# Patient Record
Sex: Female | Born: 1995 | Race: White | Hispanic: No | Marital: Married | Smoking: Never smoker
Health system: Southern US, Community
[De-identification: ages and names within clinical notes are randomized; demographics above are authoritative.]

## PROBLEM LIST (undated history)

## (undated) ENCOUNTER — Inpatient Hospital Stay (HOSPITAL_COMMUNITY): Payer: Self-pay

## (undated) DIAGNOSIS — Z8679 Personal history of other diseases of the circulatory system: Secondary | ICD-10-CM

## (undated) DIAGNOSIS — D649 Anemia, unspecified: Secondary | ICD-10-CM

## (undated) DIAGNOSIS — I471 Supraventricular tachycardia: Secondary | ICD-10-CM

## (undated) HISTORY — DX: Personal history of other diseases of the circulatory system: Z86.79

## (undated) HISTORY — PX: WISDOM TOOTH EXTRACTION: SHX21

## (undated) HISTORY — DX: Supraventricular tachycardia: I47.1

---
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
## 2015-11-29 ENCOUNTER — Ambulatory Visit (INDEPENDENT_AMBULATORY_CARE_PROVIDER_SITE_OTHER): Payer: BLUE CROSS/BLUE SHIELD | Admitting: Family Medicine

## 2015-11-29 ENCOUNTER — Encounter: Payer: Self-pay | Admitting: Family Medicine

## 2015-11-29 VITALS — BP 100/59 | HR 86 | Ht 65.0 in | Wt 131.0 lb

## 2015-11-29 DIAGNOSIS — Z7189 Other specified counseling: Secondary | ICD-10-CM | POA: Diagnosis not present

## 2015-11-29 DIAGNOSIS — N926 Irregular menstruation, unspecified: Secondary | ICD-10-CM | POA: Diagnosis not present

## 2015-11-29 DIAGNOSIS — Z0001 Encounter for general adult medical examination with abnormal findings: Secondary | ICD-10-CM | POA: Diagnosis not present

## 2015-11-29 DIAGNOSIS — Z0000 Encounter for general adult medical examination without abnormal findings: Secondary | ICD-10-CM

## 2015-11-29 DIAGNOSIS — Z23 Encounter for immunization: Secondary | ICD-10-CM

## 2015-11-29 DIAGNOSIS — Z7184 Encounter for health counseling related to travel: Secondary | ICD-10-CM

## 2015-11-29 MED ORDER — TYPHOID VACCINE PO CPDR: 1.0000 | DELAYED_RELEASE_CAPSULE | ORAL | Status: DC

## 2015-11-29 MED ORDER — ATOVAQUONE-PROGUANIL HCL 250-100 MG PO TABS: 1.0000 | ORAL_TABLET | Freq: Every day | ORAL | Status: DC

## 2015-11-29 NOTE — Progress Notes (Signed)
Subjective:     Shelby Carr is a 20 y.o. female and is here for a comprehensive physical exam. The patient reports problems - periods will occ stop for a few months at time and then restart. Seems ot happen when she is more stressed. she is not on birth control and is not sexually active.    She is traveling to [place] at the beginning of June for Mission trip. She will be there for one month. She wants to know what vaccines she will need.    Denies any prior hx of sexual activity.  Says does self breast exams.   Social History   Social History  . Marital Status: Single    Spouse Name: N/A  . Number of Children: N/A  . Years of Education: N/A   Occupational History  . Student, college    Social History Main Topics  . Smoking status: Never Smoker   . Smokeless tobacco: Not on file  . Alcohol Use: No  . Drug Use: No  . Sexual Activity: No   Other Topics Concern  . Not on file   Social History Narrative   Lives with her mother and father and sister   Health Maintenance  Topic Date Due  . HIV Screening  10/17/2009  . TETANUS/TDAP  10/17/2013  . INFLUENZA VACCINE  05/06/2015  . PAP SMEAR  10/18/2015    The following portions of the patient's history were reviewed and updated as appropriate: allergies, current medications, past family history, past medical history, past social history, past surgical history and problem list.  Review of Systems A comprehensive review of systems was negative.   Objective:    BP 100/59 mmHg  Pulse 86  Ht  (1.651 m)  Wt 131 lb (59.421 kg)  BMI 21.80 kg/m2  SpO2 99%  LMP 11/19/2015 (Approximate) General appearance: alert, cooperative and appears stated age Head: Normocephalic, without obvious abnormality, atraumatic Eyes: conj clear, EOMI, PEERLA Ears: normal TM's and external ear canals both ears Nose: Nares normal. Septum midline. Mucosa normal. No drainage or sinus tenderness. Throat: lips, mucosa, and tongue normal; teeth  and gums normal Neck: no adenopathy, no carotid bruit, no JVD, supple, symmetrical, trachea midline and thyroid not enlarged, symmetric, no tenderness/mass/nodules Back: symmetric, no curvature. ROM normal. No CVA tenderness. Lungs: clear to auscultation bilaterally Breasts: patient declined exam Heart: regular rate and rhythm, S1, S2 normal, no murmur, click, rub or gallop Abdomen: soft, non-tender; bowel sounds normal; no masses,  no organomegaly Pelvic: deferred and not sexually active and no prior hx.  Extremities: extremities normal, atraumatic, no cyanosis or edema Pulses: 2+ and symmetric Skin: Skin color, texture, turgor normal. No rashes or lesions Lymph nodes: Cervical, supraclavicular, and axillary nodes normal. Neurologic: Alert and oriented X 3, normal strength and tone. Normal symmetric reflexes. Normal coordination and gait    Assessment:    Healthy female exam.      Plan:     See After Visit Summary for Counseling Recommendations  Keep up a regular exercise program and make sure you are eating a healthy diet Try to eat 4 servings of dairy a day, or if you are lactose intolerant take a calcium with vitamin D daily.  Your vaccines are up to date.   Irregular periods. tsh 2 years ago was normal. Consider PCOS though no other overt signs of PCOS.  She declined labwork today.  Consider OCP to control cycle. She wants think about it.   Travel counseling -  She is going to menopause [place]. We discussed that she needs to get an up-to-date to that. Also recommend typhoid oral vaccine. Prescription printed. Encouraged her to take it several weeks before travel to that she complete complete the course one week before she leaves the country. She may need malaria prophylaxis as well. Will treat malarone,  New Rx sent to pharmacy to start 2 days before exposure and continue until 7 days after return to country.    She declined Gardisil vaccine today. We discussed trying to complete  series before she becomes sexually active but she says she wants to think about it.

## 2016-02-15 ENCOUNTER — Emergency Department
Admission: EM | Admit: 2016-02-15 | Discharge: 2016-02-15 | Disposition: A | Discharge status: Home / Self Care | Payer: BLUE CROSS/BLUE SHIELD | Source: Home / Self Care | Attending: Family Medicine | Admitting: Family Medicine

## 2016-02-15 ENCOUNTER — Encounter: Payer: Self-pay | Admitting: Emergency Medicine

## 2016-02-15 DIAGNOSIS — H6502 Acute serous otitis media, left ear: Secondary | ICD-10-CM

## 2016-02-15 DIAGNOSIS — J0100 Acute maxillary sinusitis, unspecified: Secondary | ICD-10-CM | POA: Diagnosis not present

## 2016-02-15 MED ORDER — AMOXICILLIN-POT CLAVULANATE 875-125 MG PO TABS: 1.0000 | ORAL_TABLET | Freq: Two times a day (BID) | ORAL | Status: DC

## 2016-02-15 NOTE — ED Provider Notes (Signed)
CSN: [id number]     Arrival date & time 02/15/16  1015 History   First MD Initiated Contact with Patient 02/15/16 1033     Chief Complaint  Patient presents with  . Nasal Congestion  . Otalgia  . Facial Pain  . Sore Throat   (Consider location/radiation/quality/duration/timing/severity/associated sxs/prior Treatment) HPI The pt is a 20yo female [facility] with c/o 4 days of worsening sinus congestion with facial pain, Left ear pain, and sore throat.  Sore throat is mild, she does not believe it feels like strep throat. Left ear pain is aching and throbbing, most bothersome for pt.  Moderate in severity.  She took Dayquil and Nyquil once.  Unknown fever. Denies n/v/d. She is home for the summer from college.  History reviewed. No pertinent past medical history. Past Surgical History  Procedure Laterality Date  . Tonsillectomy and adenoidectomy  2000   Family History  Problem Relation Age of Onset  . Alcohol abuse Maternal Grandmother   . Heart attack Paternal Grandfather   . Hyperlipidemia Maternal Grandfather   . Hypertension Paternal Grandfather    Social History  Substance Use Topics  . Smoking status: Never Smoker   . Smokeless tobacco: None  . Alcohol Use: No   OB History    No data available     Review of Systems  Constitutional: Negative for fever and chills.  HENT: Positive for congestion, ear pain (Left), rhinorrhea, sinus pressure and sore throat. Negative for trouble swallowing and voice change.   Respiratory: Negative for cough and shortness of breath.   Cardiovascular: Negative for chest pain and palpitations.  Gastrointestinal: Negative for nausea, vomiting, abdominal pain and diarrhea.  Musculoskeletal: Negative for myalgias, back pain and arthralgias.  Skin: Negative for rash.  Neurological: Positive for headaches ( frontal). Negative for dizziness and light-headedness.    Allergies  Review of patient's allergies indicates no known  allergies.  Home Medications   Prior to Admission medications   Medication Sig Start Date End Date Taking? Authorizing Provider  amoxicillin-clavulanate (AUGMENTIN) 875-125 MG tablet Take 1 tablet by mouth 2 (two) times daily. One po bid x 7 days 02/15/16   Junius FinnerErin O'Malley, PA-C  atovaquone-proguanil (MALARONE) 250-100 MG TABS tablet Take 1 tablet by mouth daily. Start 2 days before leave the country and continue for 7 more days once back in the [place] Patient taking differently: Take 1 tablet by mouth daily. Start 2 days before leave the country and continue for 7 more days once back in the [place] 11/29/15   Agapito Gamesatherine D Metheney, MD  typhoid (VIVOTIF) DR capsule Take 1 capsule by mouth every other day. X 4 doses. Complete one week prior to travel. Patient taking differently: Take 1 capsule by mouth every other day. X 4 doses. Complete one week prior to travel. 11/29/15   Agapito Gamesatherine D Metheney, MD   Meds Ordered and Administered this Visit  Medications - No data to display  BP 100/67 mmHg  Pulse 107  Temp(Src) 98.4 F (36.9 C) (Oral)  Resp 16  Ht 5\' 5"  (1.651 m)  Wt 130 lb (58.968 kg)  BMI 21.63 kg/m2  SpO2 99%  LMP 01/16/2016 (Approximate) No data found.   Physical Exam  Constitutional: She appears well-developed and well-nourished. No distress.  HENT:  Head: Normocephalic and atraumatic.  Right Ear: Tympanic membrane is scarred.  Left Ear: Tympanic membrane is scarred, erythematous and bulging. A middle ear effusion is present.  Nose: Mucosal edema present. Right sinus exhibits no maxillary  sinus tenderness and no frontal sinus tenderness. Left sinus exhibits maxillary sinus tenderness. Left sinus exhibits no frontal sinus tenderness.  Mouth/Throat: Mucous membranes are normal. Posterior oropharyngeal erythema present. No oropharyngeal exudate or posterior oropharyngeal edema.  Eyes: Conjunctivae are normal. No scleral icterus.  Neck: Normal range of motion. Neck supple.  Cardiovascular:  Normal rate, regular rhythm and normal heart sounds.   Pulmonary/Chest: Effort normal and breath sounds normal. No stridor. No respiratory distress. She has no wheezes. She has no rales.  Abdominal: Soft. She exhibits no distension. There is no tenderness.  Musculoskeletal: Normal range of motion.  Lymphadenopathy:    She has no cervical adenopathy.  Neurological: She is alert.  Skin: Skin is warm and dry. She is not diaphoretic.  Nursing note and vitals reviewed.   ED Course  Procedures (including critical care time)  Labs Review Labs Reviewed - No data to display  Imaging Review No results found.    MDM   1. Acute serous otitis media of left ear, recurrence not specified   2. Acute maxillary sinusitis, recurrence not specified    Pt c/o 4 days of worsening sinus symptoms. Left ear c/w AOM  Rx: Augmentin  Advised pt to use acetaminophen and ibuprofen as needed for fever and pain. Encouraged rest and fluids. F/u with PCP in 7-10 days if not improving, sooner if worsening. Pt verbalized understanding and agreement with tx plan.    Junius Finner, PA-C 02/15/16 1113

## 2016-02-15 NOTE — ED Notes (Signed)
Patient report 4 days of progressive worsening sinus congestion and pressure with ear pain; today throat is a little sore; no known fever.

## 2016-02-15 NOTE — Discharge Instructions (Signed)

## 2017-10-01 ENCOUNTER — Ambulatory Visit (INDEPENDENT_AMBULATORY_CARE_PROVIDER_SITE_OTHER): Payer: BLUE CROSS/BLUE SHIELD | Admitting: Family Medicine

## 2017-10-01 ENCOUNTER — Encounter: Payer: Self-pay | Admitting: Family Medicine

## 2017-10-01 VITALS — BP 111/54 | HR 99 | Ht 65.0 in | Wt 130.0 lb

## 2017-10-01 DIAGNOSIS — Z0000 Encounter for general adult medical examination without abnormal findings: Secondary | ICD-10-CM

## 2017-10-01 DIAGNOSIS — Z23 Encounter for immunization: Secondary | ICD-10-CM | POA: Diagnosis not present

## 2017-10-01 NOTE — Addendum Note (Signed)
Addended by: Deno EtienneBARKLEY, Francetta Ilg L on: 10/01/2017 12:27 PM   Modules accepted: Orders

## 2017-10-01 NOTE — Progress Notes (Signed)
Subjective:     Shelby Carr is a 21 y.o. female and is here for a comprehensive physical exam. The patient reports no problems.she is doing well. Finishing up grad school for [employer]. She will be done this summer and then looking for a job. She has never been sexually active.    Social History   Socioeconomic History  . Marital status: Single    Spouse name: Not on file  . Number of children: Not on file  . Years of education: Not on file  . Highest education level: Not on file  Social Needs  . Financial resource strain: Not on file  . Food insecurity - worry: Not on file  . Food insecurity - inability: Not on file  . Transportation needs - medical: Not on file  . Transportation needs - non-medical: Not on file  Occupational History  . Occupation: Consulting civil engineertudent, college  Tobacco Use  . Smoking status: Never Smoker  . Smokeless tobacco: Never Used  Substance and Sexual Activity  . Alcohol use: No  . Drug use: No  . Sexual activity: No  Other Topics Concern  . Not on file  Social History Narrative   Lives with her mother and father and sister   Health Maintenance  Topic Date Due  . INFLUENZA VACCINE  06/03/2018 (Originally 05/05/2017)  . PAP SMEAR  10/01/2018 (Originally 10/18/2015)  . HIV Screening  10/01/2018 (Originally 10/17/2009)  . TETANUS/TDAP  11/28/2025    The following portions of the patient's history were reviewed and updated as appropriate: allergies, current medications, past family history, past medical history, past social history, past surgical history and problem list.  Review of Systems A comprehensive review of systems was negative.   Objective:    BP (!) 111/54   Pulse 99   Ht 5\' 5"  (1.651 m)   Wt 130 lb (59 kg)   LMP 09/14/2017 (Approximate)   SpO2 99%   BMI 21.63 kg/m  General appearance: alert, cooperative and appears stated age Head: Normocephalic, without obvious abnormality, atraumatic Eyes: conj clear, EOMI, PEERLA Ears: normal TM's and  external ear canals both ears Nose: Nares normal. Septum midline. Mucosa normal. No drainage or sinus tenderness. Throat: lips, mucosa, and tongue normal; teeth and gums normal Neck: no adenopathy, no carotid bruit, no JVD, supple, symmetrical, trachea midline and thyroid not enlarged, symmetric, no tenderness/mass/nodules Back: symmetric, no curvature. ROM normal. No CVA tenderness. Lungs: clear to auscultation bilaterally Breasts: normal appearance, no masses or tenderness Heart: regular rate and rhythm, S1, S2 normal, no murmur, click, rub or gallop Abdomen: soft, non-tender; bowel sounds normal; no masses,  no organomegaly Extremities: extremities normal, atraumatic, no cyanosis or edema Pulses: 2+ and symmetric Skin: Skin color, texture, turgor normal. No rashes or lesions Lymph nodes: Cervical, supraclavicular, and axillary nodes normal. Neurologic: Alert and oriented X 3, normal strength and tone. Normal symmetric reflexes. Normal coordination and gait    Assessment:    Healthy female exam.      Plan:     See After Visit Summary for Counseling Recommendations   Keep up a regular exercise program and make sure you are eating a healthy diet Try to eat 4 servings of dairy a day, or if you are lactose intolerant take a calcium with vitamin D daily.  Your vaccines are up to date.  Will defer pap smear for one year sine never sexually active. Did perform Urine GC/chlamydia.   Declined flu vaccine Given first Gardasil. F/U in 2 months  for 2nd.

## 2017-10-01 NOTE — Patient Instructions (Addendum)

## 2017-10-02 LAB — C. TRACHOMATIS/N. GONORRHOEAE RNA
C. TRACHOMATIS RNA, TMA: NOT DETECTED
N. GONORRHOEAE RNA, TMA: NOT DETECTED

## 2017-12-03 ENCOUNTER — Ambulatory Visit: Payer: BLUE CROSS/BLUE SHIELD

## 2017-12-10 ENCOUNTER — Ambulatory Visit (INDEPENDENT_AMBULATORY_CARE_PROVIDER_SITE_OTHER): Payer: BLUE CROSS/BLUE SHIELD | Admitting: Family Medicine

## 2017-12-10 VITALS — BP 105/65 | HR 99 | Wt 130.0 lb

## 2017-12-10 DIAGNOSIS — Z23 Encounter for immunization: Secondary | ICD-10-CM

## 2017-12-10 NOTE — Progress Notes (Signed)
Agree with documentation as above.   Jaman Aro, MD  

## 2017-12-10 NOTE — Progress Notes (Signed)
   Subjective:    Patient ID: Shelby LamingEmily A Carr, female    DOB: [birth year], 22 y.o.   MRN: [id number]  HPI  Shelby Carr is here for 2 nd HPV vaccine. Denies any problems with the first vaccine.   Review of Systems     Objective:   Physical Exam        Assessment & Plan:  Patient tolerated injection well without complications. Patient advised to schedule next injection 6 months from first injection.

## 2018-04-01 ENCOUNTER — Ambulatory Visit (INDEPENDENT_AMBULATORY_CARE_PROVIDER_SITE_OTHER): Payer: BLUE CROSS/BLUE SHIELD | Admitting: Family Medicine

## 2018-04-01 VITALS — BP 103/66 | HR 90 | Temp 98.5°F

## 2018-04-01 DIAGNOSIS — Z23 Encounter for immunization: Secondary | ICD-10-CM

## 2018-04-01 NOTE — Progress Notes (Signed)
Agree with documentation as above.   Gregorey Nabor, MD  

## 2018-04-01 NOTE — Progress Notes (Signed)
Pt came into clinic today for 3rd and final HPV vaccine. Pt reports no negative side effects from previous vaccines. Pt tolerated injection in left deltoid well, no immediate complications. Advised to contact clinic with any questions or concerns. [facility] updated.

## 2019-03-22 ENCOUNTER — Telehealth: Payer: Self-pay | Admitting: Family Medicine

## 2019-03-22 NOTE — Telephone Encounter (Signed)
Do I have to see a gynecologist now that I am almost 24?

## 2019-03-22 NOTE — Telephone Encounter (Signed)
Sent pt a mychart message regarding this.Maryruth Eve, Lahoma Crocker, CMA

## 2019-04-06 ENCOUNTER — Ambulatory Visit (INDEPENDENT_AMBULATORY_CARE_PROVIDER_SITE_OTHER): Payer: BC Managed Care – PPO | Admitting: Family Medicine

## 2019-04-06 ENCOUNTER — Encounter: Payer: Self-pay | Admitting: Family Medicine

## 2019-04-06 ENCOUNTER — Other Ambulatory Visit (HOSPITAL_COMMUNITY)
Admission: RE | Admit: 2019-04-06 | Discharge: 2019-04-06 | Disposition: A | Discharge status: Home / Self Care | Payer: BC Managed Care – PPO | Source: Ambulatory Visit | Attending: Family Medicine | Admitting: Family Medicine

## 2019-04-06 VITALS — BP 121/81 | HR 118 | Temp 99.1°F | Ht 65.0 in | Wt 123.0 lb

## 2019-04-06 DIAGNOSIS — Z01419 Encounter for gynecological examination (general) (routine) without abnormal findings: Secondary | ICD-10-CM | POA: Insufficient documentation

## 2019-04-06 DIAGNOSIS — Z124 Encounter for screening for malignant neoplasm of cervix: Secondary | ICD-10-CM

## 2019-04-06 DIAGNOSIS — Z0000 Encounter for general adult medical examination without abnormal findings: Secondary | ICD-10-CM | POA: Diagnosis not present

## 2019-04-06 NOTE — Patient Instructions (Signed)
 Preventive Care 21-24 Years Old, Female Preventive care refers to visits with your health care provider and lifestyle choices that can promote health and wellness. This includes:  A yearly physical exam. This may also be called an annual well check.  Regular dental visits and eye exams.  Immunizations.  Screening for certain conditions.  Healthy lifestyle choices, such as eating a healthy diet, getting regular exercise, not using drugs or products that contain nicotine and tobacco, and limiting alcohol use. What can I expect for my preventive care visit? Physical exam Your health care provider will check your:  Height and weight. This may be used to calculate body mass index (BMI), which tells if you are at a healthy weight.  Heart rate and blood pressure.  Skin for abnormal spots. Counseling Your health care provider may ask you questions about your:  Alcohol, tobacco, and drug use.  Emotional well-being.  Home and relationship well-being.  Sexual activity.  Eating habits.  Work and work environment.  Method of birth control.  Menstrual cycle.  Pregnancy history. What immunizations do I need?  Influenza (flu) vaccine  This is recommended every year. Tetanus, diphtheria, and pertussis (Tdap) vaccine  You may need a Td booster every 10 years. Varicella (chickenpox) vaccine  You may need this if you have not been vaccinated. Human papillomavirus (HPV) vaccine  If recommended by your health care provider, you may need three doses over 6 months. Measles, mumps, and rubella (MMR) vaccine  You may need at least one dose of MMR. You may also need a second dose. Meningococcal conjugate (MenACWY) vaccine  One dose is recommended if you are age 19-21 years and a first-year college student living in a residence hall, or if you have one of several medical conditions. You may also need additional booster doses. Pneumococcal conjugate (PCV13) vaccine  You may need  this if you have certain conditions and were not previously vaccinated. Pneumococcal polysaccharide (PPSV23) vaccine  You may need one or two doses if you smoke cigarettes or if you have certain conditions. Hepatitis A vaccine  You may need this if you have certain conditions or if you travel or work in places where you may be exposed to hepatitis A. Hepatitis B vaccine  You may need this if you have certain conditions or if you travel or work in places where you may be exposed to hepatitis B. Haemophilus influenzae type b (Hib) vaccine  You may need this if you have certain conditions. You may receive vaccines as individual doses or as more than one vaccine together in one shot (combination vaccines). Talk with your health care provider about the risks and benefits of combination vaccines. What tests do I need?  Blood tests  Lipid and cholesterol levels. These may be checked every 5 years starting at age 20.  Hepatitis C test.  Hepatitis B test. Screening  Diabetes screening. This is done by checking your blood sugar (glucose) after you have not eaten for a while (fasting).  Sexually transmitted disease (STD) testing.  BRCA-related cancer screening. This may be done if you have a family history of breast, ovarian, tubal, or peritoneal cancers.  Pelvic exam and Pap test. This may be done every 3 years starting at age 21. Starting at age 30, this may be done every 5 years if you have a Pap test in combination with an HPV test. Talk with your health care provider about your test results, treatment options, and if necessary, the need for more   tests. Follow these instructions at home: Eating and drinking   Eat a diet that includes fresh fruits and vegetables, whole grains, lean protein, and low-fat dairy.  Take vitamin and mineral supplements as recommended by your health care provider.  Do not drink alcohol if: ? Your health care provider tells you not to drink. ? You are  pregnant, may be pregnant, or are planning to become pregnant.  If you drink alcohol: ? Limit how much you have to 0-1 drink a day. ? Be aware of how much alcohol is in your drink. In the [place] one drink equals one 12 oz bottle of beer (355 mL), one 5 oz glass of wine (148 mL), or one 1 oz glass of hard liquor (44 mL). Lifestyle  Take daily care of your teeth and gums.  Stay active. Exercise for at least 30 minutes on 5 or more days each week.  Do not use any products that contain nicotine or tobacco, such as cigarettes, e-cigarettes, and chewing tobacco. If you need help quitting, ask your health care provider.  If you are sexually active, practice safe sex. Use a condom or other form of birth control (contraception) in order to prevent pregnancy and STIs (sexually transmitted infections). If you plan to become pregnant, see your health care provider for a preconception visit. What's next?  Visit your health care provider once a year for a well check visit.  Ask your health care provider how often you should have your eyes and teeth checked.  Stay up to date on all vaccines. This information is not intended to replace advice given to you by your health care provider. Make sure you discuss any questions you have with your health care provider. Document Released: 11/17/2001 Document Revised: 06/02/2018 Document Reviewed: 06/02/2018 Elsevier Patient Education  2020 [employer].

## 2019-04-06 NOTE — Progress Notes (Signed)
Subjective:     Shelby Carr is a 23 y.o. female and is here for a comprehensive physical exam. The patient reports no problems. She is a Systems developerspecial ed teacher.  She is not working out currently. She is not and has never been sexually active.    Social History   Socioeconomic History  . Marital status: Single    Spouse name: Not on file  . Number of children: Not on file  . Years of education: Not on file  . Highest education level: Not on file  Occupational History  . Occupation: Consulting civil engineertudent, college  Social Needs  . Financial resource strain: Not on file  . Food insecurity    Worry: Not on file    Inability: Not on file  . Transportation needs    Medical: Not on file    Non-medical: Not on file  Tobacco Use  . Smoking status: Never Smoker  . Smokeless tobacco: Never Used  Substance and Sexual Activity  . Alcohol use: No  . Drug use: No  . Sexual activity: Never  Lifestyle  . Physical activity    Days per week: Not on file    Minutes per session: Not on file  . Stress: Not on file  Relationships  . Social Musicianconnections    Talks on phone: Not on file    Gets together: Not on file    Attends religious service: Not on file    Active member of club or organization: Not on file    Attends meetings of clubs or organizations: Not on file    Relationship status: Not on file  . Intimate partner violence    Fear of current or ex partner: Not on file    Emotionally abused: Not on file    Physically abused: Not on file    Forced sexual activity: Not on file  Other Topics Concern  . Not on file  Social History Narrative   Lives with her mother and father and sister   Health Maintenance  Topic Date Due  . HIV Screening  10/17/2009  . PAP-Cervical Cytology Screening  10/18/2015  . PAP SMEAR-Modifier  10/18/2015  . INFLUENZA VACCINE  05/06/2019  . TETANUS/TDAP  11/28/2025    The following portions of the patient's history were reviewed and updated as appropriate: allergies,  current medications, past family history, past medical history, past social history, past surgical history and problem list.  Review of Systems A comprehensive review of systems was negative.   Objective:    BP 121/81   Pulse (!) 118   Temp 99.1 F (37.3 C) (Oral)   Ht 5\' 5"  (1.651 m)   Wt 123 lb (55.8 kg)   SpO2 100%   BMI 20.47 kg/m  General appearance: alert, cooperative and appears stated age Head: Normocephalic, without obvious abnormality, atraumatic Eyes: conj clear, EOMI, PEERLA Ears: normal TM's and external ear canals both ears Nose: Nares normal. Septum midline. Mucosa normal. No drainage or sinus tenderness. Throat: lips, mucosa, and tongue normal; teeth and gums normal Neck: no adenopathy, no carotid bruit, no JVD, supple, symmetrical, trachea midline and thyroid not enlarged, symmetric, no tenderness/mass/nodules Back: symmetric, no curvature. ROM normal. No CVA tenderness. Lungs: clear to auscultation bilaterally Breasts: normal appearance, no masses or tenderness Heart: regular rate and rhythm, S1, S2 normal, no murmur, click, rub or gallop Abdomen: soft, non-tender; bowel sounds normal; no masses,  no organomegaly Pelvic: cervix normal in appearance, external genitalia normal, no adnexal masses or tenderness,  no cervical motion tenderness, rectovaginal septum normal, uterus normal size, shape, and consistency and vagina normal without discharge Extremities: extremities normal, atraumatic, no cyanosis or edema Pulses: 2+ and symmetric Skin: Skin color, texture, turgor normal. No rashes or lesions Lymph nodes: Cervical, supraclavicular, and axillary nodes normal. Neurologic: Alert and oriented X 3, normal strength and tone. Normal symmetric reflexes. Normal coordination and gait    Assessment:    Healthy female exam.      Plan:     See After Visit Summary for Counseling Recommendations    Keep up a regular exercise program and make sure you are eating a  healthy diet Try to eat 4 servings of dairy a day, or if you are lactose intolerant take a calcium with vitamin D daily.  Your vaccines are up to date.  Pap smear results pending.

## 2019-04-07 LAB — COMPLETE METABOLIC PANEL WITH GFR
AG Ratio: 1.9 (calc) (ref 1.0–2.5)
ALT: 11 U/L (ref 6–29)
AST: 17 U/L (ref 10–30)
Albumin: 4.6 g/dL (ref 3.6–5.1)
Alkaline phosphatase (APISO): 44 U/L (ref 31–125)
BUN: 9 mg/dL (ref 7–25)
CO2: 23 mmol/L (ref 20–32)
Calcium: 9.8 mg/dL (ref 8.6–10.2)
Chloride: 104 mmol/L (ref 98–110)
Creat: 0.62 mg/dL (ref 0.50–1.10)
GFR, Est African American: 146 mL/min/{1.73_m2} (ref >=60)
GFR, Est Non African American: 126 mL/min/{1.73_m2} (ref >=60)
Globulin: 2.4 g/dL (calc) (ref 1.9–3.7)
Glucose, Bld: 110 mg/dL — ABNORMAL HIGH (ref 65–99)
Potassium: 4.2 mmol/L (ref 3.5–5.3)
Sodium: 136 mmol/L (ref 135–146)
Total Bilirubin: 0.6 mg/dL (ref 0.2–1.2)
Total Protein: 7 g/dL (ref 6.1–8.1)

## 2019-04-07 LAB — LIPID PANEL
Cholesterol: 218 mg/dL — ABNORMAL HIGH (ref <200)
HDL: 56 mg/dL (ref >=50)
LDL Cholesterol (Calc): 136 mg/dL (calc) — ABNORMAL HIGH
Non-HDL Cholesterol (Calc): 162 mg/dL (calc) — ABNORMAL HIGH (ref <130)
Total CHOL/HDL Ratio: 3.9 (calc) (ref <5.0)
Triglycerides: 137 mg/dL (ref <150)

## 2019-04-07 LAB — CBC
HCT: 40.5 % (ref 35.0–45.0)
Hemoglobin: 13.7 g/dL (ref 11.7–15.5)
MCH: 28 pg (ref 27.0–33.0)
MCHC: 33.8 g/dL (ref 32.0–36.0)
MCV: 82.8 fL (ref 80.0–100.0)
MPV: 11.6 fL (ref 7.5–12.5)
Platelets: 277 10*3/uL (ref 140–400)
RBC: 4.89 10*6/uL (ref 3.80–5.10)
RDW: 12.6 % (ref 11.0–15.0)
WBC: 6.7 10*3/uL (ref 3.8–10.8)

## 2019-04-11 LAB — CYTOLOGY - PAP
Chlamydia: NEGATIVE
Diagnosis: NEGATIVE
Neisseria Gonorrhea: NEGATIVE

## 2020-04-02 ENCOUNTER — Inpatient Hospital Stay
Admission: RE | Admit: 2020-04-02 | Discharge: 2020-04-02 | Disposition: A | Discharge status: Home / Self Care | Payer: BC Managed Care – PPO | Source: Ambulatory Visit

## 2020-04-02 ENCOUNTER — Encounter: Payer: Self-pay | Admitting: Family Medicine

## 2020-04-03 ENCOUNTER — Inpatient Hospital Stay: Admission: RE | Admit: 2020-04-03 | Payer: BC Managed Care – PPO | Source: Ambulatory Visit

## 2020-04-03 ENCOUNTER — Other Ambulatory Visit: Payer: Self-pay

## 2020-04-03 ENCOUNTER — Encounter: Payer: Self-pay | Admitting: Emergency Medicine

## 2020-04-03 ENCOUNTER — Ambulatory Visit
Admission: EM | Admit: 2020-04-03 | Discharge: 2020-04-03 | Disposition: A | Discharge status: Home / Self Care | Payer: BC Managed Care – PPO | Attending: Emergency Medicine | Admitting: Emergency Medicine

## 2020-04-03 DIAGNOSIS — N390 Urinary tract infection, site not specified: Secondary | ICD-10-CM | POA: Diagnosis not present

## 2020-04-03 DIAGNOSIS — R319 Hematuria, unspecified: Secondary | ICD-10-CM | POA: Diagnosis not present

## 2020-04-03 LAB — POCT URINALYSIS DIP (MANUAL ENTRY)
Bilirubin, UA: NEGATIVE
Glucose, UA: NEGATIVE mg/dL
Ketones, POC UA: NEGATIVE mg/dL
Nitrite, UA: NEGATIVE
Protein Ur, POC: 100 mg/dL — AB
Spec Grav, UA: 1.02 (ref 1.010–1.025)
Urobilinogen, UA: 0.2 E.U./dL
pH, UA: 5 (ref 5.0–8.0)

## 2020-04-03 LAB — POCT URINE PREGNANCY: Preg Test, Ur: NEGATIVE

## 2020-04-03 MED ORDER — NITROFURANTOIN MONOHYD MACRO 100 MG PO CAPS: 100.0000 mg | ORAL_CAPSULE | Freq: Two times a day (BID) | ORAL | 0 refills | Status: DC

## 2020-04-03 MED ORDER — PHENAZOPYRIDINE HCL 200 MG PO TABS: 200.0000 mg | ORAL_TABLET | Freq: Three times a day (TID) | ORAL | 0 refills | Status: DC | PRN

## 2020-04-03 NOTE — Discharge Instructions (Addendum)
Pyridium will turn your urine orange but will help with your symptoms.  The Macrobid will treat the infection.  Make sure you finish it, even if you feel better.  Continue pushing plenty of extra fluids.  Takes 400 mg of ibuprofen combined with 500 -1000 mg of Tylenol 3-4 times a day as needed for pain.

## 2020-04-03 NOTE — ED Notes (Signed)
Patient able to ambulate independently  

## 2020-04-03 NOTE — ED Triage Notes (Signed)
Pt presents to [facility] for assessment of urinary frequency x 1 week, with worsening last night with left sided flank pain.

## 2020-04-03 NOTE — ED Provider Notes (Signed)
HPI  SUBJECTIVE:  Shelby Carr is a 24 y.o. female who presents with 6 days of dysuria, urgency, frequency. She reports nonradiating crampy left-sided low back pain starting last night. She denies cloudy or odorous urine, hematuria. She states that she is able to completely empty her bladder. No nausea, vomiting, fevers, abdominal, pelvic pain. No vaginal odor, bleeding, discharge, rash, itching. She states that she has never been sexually active. She has never had symptoms like this before. No antibiotics in the past month. No antipyretic in the past 4 to 6 hours. She tried pushing fluids with some improvement in her symptoms. No aggravating factors. Past medical history negative for UTI, nephrolithiasis, diabetes, hypertension, BV, yeast. LMP: 6/8. Denies the possibility of being pregnant. LOV:FIEPPIRJ, Barbarann Ehlers, MD  History reviewed. No pertinent past medical history.  Past Surgical History:  Procedure Laterality Date  . TONSILLECTOMY AND ADENOIDECTOMY  2000    Family History  Problem Relation Age of Onset  . Alcohol abuse Maternal Grandmother   . Heart attack Paternal Grandfather   . Hypertension Paternal Grandfather   . Hyperlipidemia Maternal Grandfather     Social History   Tobacco Use  . Smoking status: Never Smoker  . Smokeless tobacco: Never Used  Substance Use Topics  . Alcohol use: No  . Drug use: No    No current facility-administered medications for this encounter.  Current Outpatient Medications:  .  nitrofurantoin, macrocrystal-monohydrate, (MACROBID) 100 MG capsule, Take 1 capsule (100 mg total) by mouth 2 (two) times daily. X 5 days, Disp: 10 capsule, Rfl: 0 .  phenazopyridine (PYRIDIUM) 200 MG tablet, Take 1 tablet (200 mg total) by mouth 3 (three) times daily as needed for pain., Disp: 6 tablet, Rfl: 0  No Known Allergies   ROS  As noted in HPI.   Physical Exam  BP 117/79 (BP Location: Left Arm)   Pulse (!) 109   Temp 98.7 F (37.1 C) (Oral)    Resp 18   LMP 03/12/2020   SpO2 99%   Constitutional: Well developed, well nourished, no acute distress Eyes:  EOMI, conjunctiva normal bilaterally HENT: Normocephalic, atraumatic,mucus membranes moist Respiratory: Normal inspiratory effort Cardiovascular: Mild tachycardia GI: nondistended soft, nontender.  Active bowel sounds.  No guarding or rebound.  No suprapubic, flank tenderness Back: No CVAT bilaterally.   skin: No rash, skin intact Musculoskeletal: no deformities Neurologic: Alert & oriented x 3, no focal neuro deficits Psychiatric: Speech and behavior appropriate   ED Course   Medications - No data to display  Orders Placed This Encounter  Procedures  . Urine Culture    Standing Status:   Standing    Number of Occurrences:   1    Order Specific Question:   List patient's active antibiotics    Answer:   macrobid  . POCT urinalysis dipstick    Standing Status:   Standing    Number of Occurrences:   1  . POCT urine pregnancy    Standing Status:   Standing    Number of Occurrences:   1    Results for orders placed or performed during the hospital encounter of 04/03/20 (from the past 24 hour(s))  POCT urinalysis dipstick     Status: Abnormal   Collection Time: 04/03/20  1:33 PM  Result Value Ref Range   Color, UA yellow yellow   Clarity, UA clear clear   Glucose, UA negative negative mg/dL   Bilirubin, UA negative negative   Ketones, POC UA negative negative  mg/dL   Spec Grav, UA 0.263 7.858 - 1.025   Blood, UA large (A) negative   pH, UA 5.0 5.0 - 8.0   Protein Ur, POC =100 (A) negative mg/dL   Urobilinogen, UA 0.2 0.2 or 1.0 E.U./dL   Nitrite, UA Negative Negative   Leukocytes, UA Small (1+) (A) Negative  POCT urine pregnancy     Status: None   Collection Time: 04/03/20  1:33 PM  Result Value Ref Range   Preg Test, Ur Negative Negative   No results found.  ED Clinical Impression  1. Urinary tract infection with hematuria, site unspecified       ED Assessment/Plan  Presentation most consistent with a UTI.  Will send urine off for culture to confirm diagnosis and antibiotic choice.  She states that she has never been sexually active.  Home with Macrobid for 5 days, Pyridium, Tylenol/ibuprofen together 3-4 times a day as needed, continue pushing fluids.  Follow-up with PMD as needed, to the ER if she gets worse.  Discussed labs,  MDM, treatment plan, and plan for follow-up with patient. Discussed sn/sx that should prompt return to the ED. patient agrees with plan.   Meds ordered this encounter  Medications  . phenazopyridine (PYRIDIUM) 200 MG tablet    Sig: Take 1 tablet (200 mg total) by mouth 3 (three) times daily as needed for pain.    Dispense:  6 tablet    Refill:  0  . nitrofurantoin, macrocrystal-monohydrate, (MACROBID) 100 MG capsule    Sig: Take 1 capsule (100 mg total) by mouth 2 (two) times daily. X 5 days    Dispense:  10 capsule    Refill:  0    *This clinic note was created using Scientist, clinical (histocompatibility and immunogenetics). Therefore, there may be occasional mistakes despite careful proofreading.   ?    Domenick Gong, MD 04/03/20 1527

## 2020-04-04 LAB — URINE CULTURE

## 2020-08-28 ENCOUNTER — Ambulatory Visit (INDEPENDENT_AMBULATORY_CARE_PROVIDER_SITE_OTHER): Payer: BC Managed Care – PPO | Admitting: Family Medicine

## 2020-08-28 ENCOUNTER — Encounter: Payer: Self-pay | Admitting: Family Medicine

## 2020-08-28 VITALS — BP 108/66 | HR 91 | Ht 65.0 in | Wt 132.0 lb

## 2020-08-28 DIAGNOSIS — Z113 Encounter for screening for infections with a predominantly sexual mode of transmission: Secondary | ICD-10-CM | POA: Diagnosis not present

## 2020-08-28 DIAGNOSIS — Z309 Encounter for contraceptive management, unspecified: Secondary | ICD-10-CM

## 2020-08-28 DIAGNOSIS — Z0000 Encounter for general adult medical examination without abnormal findings: Secondary | ICD-10-CM

## 2020-08-28 NOTE — Progress Notes (Signed)
Subjective:     Shelby Carr is a 24 y.o. female and is here for a comprehensive physical exam. The patient reports no problems.  She is doing well overall.  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  She is engaged and is getting married in April.  She wanted to discuss birth control options.  She is most interested in an IUD.  Social History   Socioeconomic History  . Marital status: Single    Spouse name: Not on file  . Number of children: Not on file  . Years of education: Not on file  . Highest education level: Not on file  Occupational History  . Occupation: Consulting civil engineer, college  Tobacco Use  . Smoking status: Never Smoker  . Smokeless tobacco: Never Used  Substance and Sexual Activity  . Alcohol use: No  . Drug use: No  . Sexual activity: Never  Other Topics Concern  . Not on file  Social History Narrative   Lives with her mother and father and sister   Social Determinants of Health   Financial Resource Strain:   . Difficulty of Paying Living Expenses: Not on file  Food Insecurity:   . Worried About Programme researcher, broadcasting/film/video in the Last Year: Not on file  . Ran Out of Food in the Last Year: Not on file  Transportation Needs:   . Lack of Transportation (Medical): Not on file  . Lack of Transportation (Non-Medical): Not on file  Physical Activity:   . Days of Exercise per Week: Not on file  . Minutes of Exercise per Session: Not on file  Stress:   . Feeling of Stress : Not on file  Social Connections:   . Frequency of Communication with Friends and Family: Not on file  . Frequency of Social Gatherings with Friends and Family: Not on file  . Attends Religious Services: Not on file  . Active Member of Clubs or Organizations: Not on file  . Attends Banker Meetings: Not on file  . Marital Status: Not on file  Intimate Partner Violence:   . Fear of Current or Ex-Partner: Not on file  . Emotionally Abused: Not on file  . Physically Abused: Not on file  . Sexually  Abused: Not on file   Health Maintenance  Topic Date Due  . Hepatitis C Screening  Never done  . HIV Screening  Never done  . PAP-Cervical Cytology Screening  04/05/2022  . PAP SMEAR-Modifier  04/05/2022  . TETANUS/TDAP  11/28/2025  . INFLUENZA VACCINE  Completed  . COVID-19 Vaccine  Completed    The following portions of the patient's history were reviewed and updated as appropriate: allergies, current medications, past family history, past medical history, past social history, past surgical history and problem list.  Review of Systems A comprehensive review of systems was negative.   Objective:    BP 108/66   Pulse 91   Ht 5\' 5"  (1.651 m)   Wt 132 lb (59.9 kg)   LMP 08/07/2020 (Exact Date)   SpO2 100%   BMI 21.97 kg/m  General appearance: alert, cooperative and appears stated age Head: Normocephalic, without obvious abnormality, atraumatic Eyes: conj clear, EOMi, PEERLA Ears: normal TM's and external ear canals both ears Nose: Nares normal. Septum midline. Mucosa normal. No drainage or sinus tenderness. Throat: lips, mucosa, and tongue normal; teeth and gums normal Neck: no adenopathy, no carotid bruit, no JVD, supple, symmetrical, trachea midline and thyroid not enlarged, symmetric, no tenderness/mass/nodules  Back: symmetric, no curvature. ROM normal. No CVA tenderness. Lungs: clear to auscultation bilaterally Breasts: normal appearance, no masses or tenderness Heart: regular rate and rhythm, S1, S2 normal, no murmur, click, rub or gallop Abdomen: soft, non-tender; bowel sounds normal; no masses,  no organomegaly Extremities: extremities normal, atraumatic, no cyanosis or edema Pulses: 2+ and symmetric Skin: Skin color, texture, turgor normal. No rashes or lesions Lymph nodes: Cervical, supraclavicular, and axillary nodes normal. Neurologic: Alert and oriented X 3, normal strength and tone. Normal symmetric reflexes. Normal coordination and gait    Assessment:     Healthy female exam.      Plan:     See After Visit Summary for Counseling Recommendations   Keep up a regular exercise program and make sure you are eating a healthy diet Try to eat 4 servings of dairy a day, or if you are lactose intolerant take a calcium with vitamin D daily.  Your vaccines are up to date.  She declines to have blood work this year but says she will consider it for next year.   Contraceptive of counseling-discussed birth control options.  She is still most interested in an IUD.  Place referral to GYN down the hall for IUD placement.

## 2020-08-28 NOTE — Patient Instructions (Signed)
Health Maintenance, Female Adopting a healthy lifestyle and getting preventive care are important in promoting health and wellness. Ask your health care provider about:  The right schedule for you to have regular tests and exams.  Things you can do on your own to prevent diseases and keep yourself healthy. What should I know about diet, weight, and exercise? Eat a healthy diet   Eat a diet that includes plenty of vegetables, fruits, low-fat dairy products, and lean protein.  Do not eat a lot of foods that are high in solid fats, added sugars, or sodium. Maintain a healthy weight Body mass index (BMI) is used to identify weight problems. It estimates body fat based on height and weight. Your health care provider can help determine your BMI and help you achieve or maintain a healthy weight. Get regular exercise Get regular exercise. This is one of the most important things you can do for your health. Most adults should:  Exercise for at least 150 minutes each week. The exercise should increase your heart rate and make you sweat (moderate-intensity exercise).  Do strengthening exercises at least twice a week. This is in addition to the moderate-intensity exercise.  Spend less time sitting. Even light physical activity can be beneficial. Watch cholesterol and blood lipids Have your blood tested for lipids and cholesterol at 25 years of age, then have this test every 5 years. Have your cholesterol levels checked more often if:  Your lipid or cholesterol levels are high.  You are older than 25 years of age.  You are at high risk for heart disease. What should I know about cancer screening? Depending on your health history and family history, you may need to have cancer screening at various ages. This may include screening for:  Breast cancer.  Cervical cancer.  Colorectal cancer.  Skin cancer.  Lung cancer. What should I know about heart disease, diabetes, and high blood  pressure? Blood pressure and heart disease  High blood pressure causes heart disease and increases the risk of stroke. This is more likely to develop in people who have high blood pressure readings, are of African descent, or are overweight.  Have your blood pressure checked: ? Every 3-5 years if you are 18-39 years of age. ? Every year if you are 40 years old or older. Diabetes Have regular diabetes screenings. This checks your fasting blood sugar level. Have the screening done:  Once every three years after age 40 if you are at a normal weight and have a low risk for diabetes.  More often and at a younger age if you are overweight or have a high risk for diabetes. What should I know about preventing infection? Hepatitis B If you have a higher risk for hepatitis B, you should be screened for this virus. Talk with your health care provider to find out if you are at risk for hepatitis B infection. Hepatitis C Testing is recommended for:  Everyone born from 1945 through 1965.  Anyone with known risk factors for hepatitis C. Sexually transmitted infections (STIs)  Get screened for STIs, including gonorrhea and chlamydia, if: ? You are sexually active and are younger than 24 years of age. ? You are older than 24 years of age and your health care provider tells you that you are at risk for this type of infection. ? Your sexual activity has changed since you were last screened, and you are at increased risk for chlamydia or gonorrhea. Ask your health care provider if   you are at risk.  Ask your health care provider about whether you are at high risk for HIV. Your health care provider may recommend a prescription medicine to help prevent HIV infection. If you choose to take medicine to prevent HIV, you should first get tested for HIV. You should then be tested every 3 months for as long as you are taking the medicine. Pregnancy  If you are about to stop having your period (premenopausal) and  you may become pregnant, seek counseling before you get pregnant.  Take 400 to 800 micrograms (mcg) of folic acid every day if you become pregnant.  Ask for birth control (contraception) if you want to prevent pregnancy. Osteoporosis and menopause Osteoporosis is a disease in which the bones lose minerals and strength with aging. This can result in bone fractures. If you are 65 years old or older, or if you are at risk for osteoporosis and fractures, ask your health care provider if you should:  Be screened for bone loss.  Take a calcium or vitamin D supplement to lower your risk of fractures.  Be given hormone replacement therapy (HRT) to treat symptoms of menopause. Follow these instructions at home: Lifestyle  Do not use any products that contain nicotine or tobacco, such as cigarettes, e-cigarettes, and chewing tobacco. If you need help quitting, ask your health care provider.  Do not use street drugs.  Do not share needles.  Ask your health care provider for help if you need support or information about quitting drugs. Alcohol use  Do not drink alcohol if: ? Your health care provider tells you not to drink. ? You are pregnant, may be pregnant, or are planning to become pregnant.  If you drink alcohol: ? Limit how much you use to 0-1 drink a day. ? Limit intake if you are breastfeeding.  Be aware of how much alcohol is in your drink. In the [place] one drink equals one 12 oz bottle of beer (355 mL), one 5 oz glass of wine (148 mL), or one 1 oz glass of hard liquor (44 mL). General instructions  Schedule regular health, dental, and eye exams.  Stay current with your vaccines.  Tell your health care provider if: ? You often feel depressed. ? You have ever been abused or do not feel safe at home. Summary  Adopting a healthy lifestyle and getting preventive care are important in promoting health and wellness.  Follow your health care provider's instructions about healthy  diet, exercising, and getting tested or screened for diseases.  Follow your health care provider's instructions on monitoring your cholesterol and blood pressure. This information is not intended to replace advice given to you by your health care provider. Make sure you discuss any questions you have with your health care provider. Document Revised: 09/14/2018 Document Reviewed: 09/14/2018 Elsevier Patient Education  2020 [employer]vier Inc.  

## 2020-08-29 LAB — C. TRACHOMATIS/N. GONORRHOEAE RNA
C. trachomatis RNA, TMA: NOT DETECTED
N. gonorrhoeae RNA, TMA: NOT DETECTED

## 2020-10-31 ENCOUNTER — Encounter: Payer: Self-pay | Admitting: Obstetrics and Gynecology

## 2020-10-31 ENCOUNTER — Ambulatory Visit (INDEPENDENT_AMBULATORY_CARE_PROVIDER_SITE_OTHER): Payer: BC Managed Care – PPO | Admitting: Obstetrics and Gynecology

## 2020-10-31 ENCOUNTER — Other Ambulatory Visit: Payer: Self-pay

## 2020-10-31 VITALS — BP 114/66 | HR 77 | Resp 16 | Ht 65.0 in | Wt 127.0 lb

## 2020-10-31 DIAGNOSIS — Z3009 Encounter for other general counseling and advice on contraception: Secondary | ICD-10-CM

## 2020-10-31 DIAGNOSIS — Z30430 Encounter for insertion of intrauterine contraceptive device: Secondary | ICD-10-CM | POA: Diagnosis not present

## 2020-10-31 DIAGNOSIS — Z01812 Encounter for preprocedural laboratory examination: Secondary | ICD-10-CM

## 2020-10-31 LAB — POCT URINE PREGNANCY: Preg Test, Ur: NEGATIVE

## 2020-10-31 MED ORDER — LEVONORGESTREL 19.5 MCG/DAY IU IUD: INTRAUTERINE_SYSTEM | Freq: Once | INTRAUTERINE | Status: DC

## 2020-10-31 MED ORDER — PARAGARD INTRAUTERINE COPPER IU IUD: INTRAUTERINE_SYSTEM | Freq: Once | INTRAUTERINE | Status: AC

## 2020-10-31 NOTE — Progress Notes (Signed)
   GYNECOLOGY OFFICE NOTE  History:  25 y.o. G0P0000 here today for discussion of contraception. She has never been sexually active but plans to be after marriage in April. Has not ever been on contraception in the past. Has monthly periods, lasting 5 days, not particularly painful or heavy after first day.    Past Medical History:  Diagnosis Date  . SVT (supraventricular tachycardia) (HCC)     Past Surgical History:  Procedure Laterality Date  . TONSILLECTOMY AND ADENOIDECTOMY  2000    No current outpatient medications on file.  The following portions of the patient's history were reviewed and updated as appropriate: allergies, current medications, past family history, past medical history, past social history, past surgical history and problem list.   Review of Systems:  Pertinent items noted in HPI and remainder of comprehensive ROS otherwise negative.   Objective:  Physical Exam BP 114/66   Pulse 77   Resp 16   Ht 5\' 5"  (1.651 m)   Wt 127 lb (57.6 kg)   LMP 10/28/2020   BMI 21.13 kg/m  CONSTITUTIONAL: Well-developed, well-nourished female in no acute distress.  HENT:  Normocephalic, atraumatic. External right and left ear normal. Oropharynx is clear and moist EYES: Conjunctivae and EOM are normal. Pupils are equal, round, and reactive to light. No scleral icterus.  NECK: Normal range of motion, supple, no masses SKIN: Skin is warm and dry. No rash noted. Not diaphoretic. No erythema. No pallor. NEUROLOGIC: Alert and oriented to person, place, and time. Normal reflexes, muscle tone coordination. No cranial nerve deficit noted. PSYCHIATRIC: Normal mood and affect. Normal behavior. Normal judgment and thought content. CARDIOVASCULAR: Normal heart rate noted RESPIRATORY: Effort normal, no problems with respiration noted ABDOMEN: Soft, no distention noted.   PELVIC: Normal appearing external genitalia; normal appearing vaginal mucosa and cervix.  No abnormal discharge noted.   Normal uterine size, no other palpable masses, no uterine or adnexal tenderness. Paraguard IUD insertion done, see note MUSCULOSKELETAL: Normal range of motion. No edema noted.  Exam done with chaperone present.  Labs and Imaging No results found.  Assessment & Plan:  1. Encounter for IUD insertion See procedure note - POCT urine pregnancy  2. Encounter for counseling regarding contraception Reviewed options for birth control including oral contraceptive pills (combination and progesterone only), NuvaRing, Depo-Provera, Nexplanon, IUDs (copper and levonorgestrol). Thoroughly reviewed risks/benefits/side effects of each. Answered all questions. Patient with h/o of n/a and opts for Paraguard. - see insertion note    Routine preventative health maintenance measures emphasized. Please refer to After Visit Summary for other counseling recommendations.   Return in about 4 weeks (around 11/28/2020) for string check, in person.  Total face-to-face time with patient: 35 minutes. Over 50% of encounter was spent on counseling and coordination of care.  11/30/2020, MD, [facility] Attending Center for [facility])

## 2020-10-31 NOTE — Progress Notes (Signed)
    IUD INSERTION PROCEDURE NOTE  Shelby Carr is a 25 y.o. No obstetric history on file. here for Paraguard insertion. No GYN concerns.   She was counseled regarding the risks/benefits of IUD including insertion risk of infection, hemorrhage, damage to surrounding tissue and organs, uterine perforation. She was counseled regarding risks of IUD including implantation into uterine wall, malpositioning, misplacement out of the uterus, migration outside of uterus, possible need for hysteroscopic or laparoscopic removal, expulsion. She was advised that risk of pregnancy is low with negative UPT but is not zero and IUD insertion may cause miscarriage. Reviewed that she is also at slightly higher risk for ectopic pregnancy and she should take a pregnancy test if she believes she may be pregnant. She was advised to use backup method of protection for one week. She verbalized understanding of all of the above and consent signed.   Last intercourse was never Last pap smear was on 04/2019 and was normal UPT today: negative  IUD Insertion  Patient identified and an adequate time out was performed. Speculum placed in the vagina. The cervix was cleaned with Betadine x 2 and grasped anteriorly with a single tooth tenaculum.  A uterine sound was used to sound the uterus to 8 cm;  the IUD was then placed per manufacturer's recommendations. Strings trimmed to 3 cm. Tenaculum was removed, good hemostasis noted with silver nitrate and pressure. Patient tolerated procedure well.   Patient was given post-procedure instructions.  She was reminded to have backup contraception for one week during this transition period between IUDs.  Patient was also asked to check IUD strings periodically and follow up in 4 weeks for IUD check.  Paraguard IUD Exp: 10/2025 Lot: [id number]  K. Therese Sarah, MD, [facility] Attending Center for [facility])

## 2020-11-25 ENCOUNTER — Other Ambulatory Visit: Payer: Self-pay

## 2020-11-25 ENCOUNTER — Encounter: Payer: Self-pay | Admitting: Obstetrics and Gynecology

## 2020-11-25 ENCOUNTER — Ambulatory Visit (INDEPENDENT_AMBULATORY_CARE_PROVIDER_SITE_OTHER): Payer: BC Managed Care – PPO | Admitting: Obstetrics and Gynecology

## 2020-11-25 VITALS — BP 110/71 | HR 109 | Resp 16 | Ht 65.0 in | Wt 128.0 lb

## 2020-11-25 DIAGNOSIS — Z30431 Encounter for routine checking of intrauterine contraceptive device: Secondary | ICD-10-CM | POA: Diagnosis not present

## 2020-11-25 NOTE — Progress Notes (Signed)
   IUD Insertion Follow Up   Patient is 25 y.o. G0P0000 who is here for string check/followup after insertion of Paraguard IUD 4 weeks ago. She is doing well, had minor pain after procedure that is now resolved. She had minimal spotting after procedure and then period started and has been heavier than usual. She is worried about being on period during her wedding in two months. Reviewed she can go on OCPs on top of IUD if needed. Denies pain or other issues.  BP 110/71   Pulse (!) 109   Resp 16   Ht 5\' 5"  (1.651 m)   Wt 128 lb (58.1 kg)   LMP 11/18/2020   BMI 21.30 kg/m   Gen: alert, oriented Abd: soft, non-tender GU: normal appearing vaginal mucosa, normal appearing cervix with white strings protruding 3 cm from cervix. Strings not trimmed.    Normal post IUD insertion check. Return 1 year for annual or prn    K. 11/20/2020, MD, [facility] Attending Center for [facility])

## 2021-01-06 ENCOUNTER — Telehealth: Payer: Self-pay | Admitting: *Deleted

## 2021-01-06 MED ORDER — NORGESTIMATE-ETH ESTRADIOL 0.25-35 MG-MCG PO TABS: 1.0000 | ORAL_TABLET | Freq: Every day | ORAL | 1 refills | Status: DC

## 2021-01-06 NOTE — Telephone Encounter (Signed)
Pt called stating that she is getting married this month and had a discussion with Dr Earlene Plater about adding OCP's with her Paragard.  She is having heavy periods with her Paragard.  Spoke with Dr Penne Lash who gave a VO for Sprintec to be called into her phramcy.  Pt to go ahead and start the OCP's and double up on them 3 days prior to wedding and to make sure that she stays on the pills until after the honeymoon, to avoid any bleeding.

## 2021-02-26 ENCOUNTER — Encounter: Payer: Self-pay | Admitting: Obstetrics and Gynecology

## 2021-02-26 ENCOUNTER — Other Ambulatory Visit: Payer: Self-pay

## 2021-02-26 ENCOUNTER — Ambulatory Visit (INDEPENDENT_AMBULATORY_CARE_PROVIDER_SITE_OTHER): Payer: BC Managed Care – PPO | Admitting: Obstetrics and Gynecology

## 2021-02-26 VITALS — BP 112/71 | HR 93 | Ht 65.0 in | Wt 131.0 lb

## 2021-02-26 DIAGNOSIS — Z3009 Encounter for other general counseling and advice on contraception: Secondary | ICD-10-CM | POA: Insufficient documentation

## 2021-02-26 NOTE — Progress Notes (Signed)
    Contraception/Family Planning VISIT ENCOUNTER NOTE  Subjective:   Shelby Carr is a 25 y.o. G0P0000 female here for reproductive life counseling. She is unsure about which form from [facility]. Had a copper IUD placed in January and it fell out yesterday. She reports heavier, more painful periods with copper IUD.   Reports she does not want a pregnancy in the next year. Denies abnormal vaginal bleeding, discharge, pelvic pain, problems with intercourse or other gynecologic concerns.    Gynecologic History Patient's last menstrual period was 02/23/2021. Contraception: none  Health Maintenance Due  Topic Date Due  . HIV Screening  Never done  . Hepatitis C Screening  Never done  . COVID-19 Vaccine (3 - Booster for Pfizer series) 06/04/2020     The following portions of the patient's history were reviewed and updated as appropriate: allergies, current medications, past family history, past medical history, past social history, past surgical history and problem list.  Review of Systems Pertinent items are noted in HPI.   Objective:  BP 112/71   Pulse 93   Ht 5\' 5"  (1.651 m)   Wt 131 lb (59.4 kg)   LMP 02/23/2021   BMI 21.80 kg/m  Gen: well appearing, NAD HEENT: no scleral icterus CV: RR Lung: Normal WOB Ext: warm well perfused  Assessment and Plan:   Contraception counseling: Reviewed all forms of birth control options available including abstinence; over the counter/barrier methods; hormonal contraceptive medication including pill, patch, ring, injection,contraceptive implant; hormonal and nonhormonal IUDs; permanent sterilization options including vasectomy and the various tubal sterilization modalities. Risks and benefits reviewed.  Questions were answered.  Written information was also given to the patient to review.  Patient desires ? Skyla IUD vs. Mirena IUD, this was prescribed for patient. She will follow up with the office after discussing with partner.   She was told to  call with any further questions, or with any concerns about this method of contraception.  Emphasized use of condoms 100% of the time for STI prevention.   1. Birth control counseling  - Call the office as needed for IUD placement if she desires.    No follow-ups on file.  02/25/2021, NP, MPH, [facility] Attending Physician Faculty Practice- Center for [facility]

## 2021-04-01 ENCOUNTER — Ambulatory Visit: Payer: BC Managed Care – PPO | Admitting: Obstetrics and Gynecology

## 2021-04-01 ENCOUNTER — Encounter: Payer: Self-pay | Admitting: Obstetrics and Gynecology

## 2021-04-01 ENCOUNTER — Other Ambulatory Visit: Payer: Self-pay

## 2021-04-01 VITALS — BP 110/67 | HR 106 | Resp 16 | Ht 65.0 in | Wt 130.0 lb

## 2021-04-01 DIAGNOSIS — Z3201 Encounter for pregnancy test, result positive: Secondary | ICD-10-CM

## 2021-04-01 DIAGNOSIS — Z3200 Encounter for pregnancy test, result unknown: Secondary | ICD-10-CM

## 2021-04-01 LAB — POCT URINE PREGNANCY: Preg Test, Ur: POSITIVE — AB

## 2021-04-01 NOTE — Progress Notes (Signed)
  S:   25 y.o. G1P0000 @ [redacted]w[redacted]d by certain LMP presents to the office for pregnancy confirmation.  She denies abdominal pain or vaginal bleeding today.    Married: was using Condoms for [facility] regularly. Had an IUD that recently fell out and this appointment was originally for an IUD insertion.   O: BP 110/67   Pulse (!) 106   Resp 16   Ht 5\' 5"  (1.651 m)   Wt 130 lb (59 kg)   LMP 02/23/2021   BMI 21.63 kg/m  Physical Examination: General appearance - alert, well appearing, and in no distress, oriented to person, place, and time and acyanotic, in no respiratory distress  Results for orders placed or performed in visit on 04/01/21 (from the past 48 hour(s))  POCT urine pregnancy     Status: Abnormal   Collection Time: 04/01/21  9:14 AM  Result Value Ref Range   Preg Test, Ur Positive (A) Negative    A:  Positive pregnancy test   P:  Start prenatal vitamins.  Discussed things to avoid: heavy cleaning products, raw meat, kitty litter.  Schedule new OB with the office.    04/03/21, NP 10:21 AM

## 2021-05-05 ENCOUNTER — Encounter: Payer: Self-pay | Admitting: *Deleted

## 2021-05-05 DIAGNOSIS — Z3490 Encounter for supervision of normal pregnancy, unspecified, unspecified trimester: Secondary | ICD-10-CM | POA: Insufficient documentation

## 2021-05-05 DIAGNOSIS — Z3480 Encounter for supervision of other normal pregnancy, unspecified trimester: Secondary | ICD-10-CM | POA: Insufficient documentation

## 2021-05-05 NOTE — Progress Notes (Signed)
Last pap 7/20 

## 2021-05-08 ENCOUNTER — Other Ambulatory Visit (HOSPITAL_COMMUNITY)
Admission: RE | Admit: 2021-05-08 | Discharge: 2021-05-08 | Disposition: A | Discharge status: Home / Self Care | Payer: BC Managed Care – PPO | Source: Ambulatory Visit | Attending: Obstetrics and Gynecology | Admitting: Obstetrics and Gynecology

## 2021-05-08 ENCOUNTER — Other Ambulatory Visit: Payer: Self-pay

## 2021-05-08 ENCOUNTER — Ambulatory Visit (INDEPENDENT_AMBULATORY_CARE_PROVIDER_SITE_OTHER): Payer: BC Managed Care – PPO | Admitting: Obstetrics and Gynecology

## 2021-05-08 ENCOUNTER — Encounter: Payer: Self-pay | Admitting: Obstetrics and Gynecology

## 2021-05-08 DIAGNOSIS — Z3401 Encounter for supervision of normal first pregnancy, first trimester: Secondary | ICD-10-CM

## 2021-05-08 DIAGNOSIS — Z3A10 10 weeks gestation of pregnancy: Secondary | ICD-10-CM

## 2021-05-08 DIAGNOSIS — O99011 Anemia complicating pregnancy, first trimester: Secondary | ICD-10-CM | POA: Diagnosis not present

## 2021-05-08 NOTE — Patient Instructions (Addendum)
Obstetrics: Normal and Problem Pregnancies (7th ed., pp. 102-121). [place], [place]: Elsevier."> Textbook of Family Medicine (9th ed., pp. 365-410). [place], [place]: [employer].">  First Trimester of Pregnancy  The first trimester of pregnancy starts on the first day of your last menstrual period until the end of week 12. This is months 1 through 3 of pregnancy. A week after a sperm fertilizes an egg, the egg will implant into the wall of the uterus and begin to develop into a baby. By the end of 12 weeks, all the baby'sorgans will be formed and the baby will be 2-3 inches in size. Body changes during your first trimester Your body goes through many changes during pregnancy. The changes vary andgenerally return to normal after your baby is born. Physical changes You may gain or lose weight. Your breasts may begin to grow larger and become tender. The tissue that surrounds your nipples (areola) may become darker. Dark spots or blotches (chloasma or mask of pregnancy) may develop on your face. You may have changes in your hair. These can include thickening or thinning of your hair or changes in texture. Health changes You may feel nauseous, and you may vomit. You may have heartburn. You may develop headaches. You may develop constipation. Your gums may bleed and may be sensitive to brushing and flossing. Other changes You may tire easily. You may urinate more often. Your menstrual periods will stop. You may have a loss of appetite. You may develop cravings for certain kinds of food. You may have changes in your emotions from day to day. You may have more vivid and strange dreams. Follow these instructions at home: Medicines Follow your health care provider's instructions regarding medicine use. Specific medicines may be either safe or unsafe to take during pregnancy. Do not take any medicines unless told to by your health care provider. Take a prenatal vitamin that contains at least  600 micrograms (mcg) of folic acid. Eating and drinking Eat a healthy diet that includes fresh fruits and vegetables, whole grains, good sources of protein such as meat, eggs, or tofu, and low-fat dairy products. Avoid raw meat and unpasteurized juice, milk, and cheese. These carry germs that can harm you and your baby. If you feel nauseous or you vomit: Eat 4 or 5 small meals a day instead of 3 large meals. Try eating a few soda crackers. Drink liquids between meals instead of during meals. You may need to take these actions to prevent or treat constipation: Drink enough fluid to keep your urine pale yellow. Eat foods that are high in fiber, such as beans, whole grains, and fresh fruits and vegetables. Limit foods that are high in fat and processed sugars, such as fried or sweet foods. Activity Exercise only as directed by your health care provider. Most people can continue their usual exercise routine during pregnancy. Try to exercise for 30 minutes at least 5 days a week. Stop exercising if you develop pain or cramping in the lower abdomen or lower back. Avoid exercising if it is very hot or humid or if you are at high altitude. Avoid heavy lifting. If you choose to, you may have sex unless your health care provider tells you not to. Relieving pain and discomfort Wear a good support bra to relieve breast tenderness. Rest with your legs elevated if you have leg cramps or low back pain. If you develop bulging veins (varicose veins) in your legs: Wear support hose as told by your health care provider. Elevate   your feet for 15 minutes, 3-4 times a day. Limit salt in your diet. Safety Wear your seat belt at all times when driving or riding in a car. Talk with your health care provider if someone is verbally or physically abusive to you. Talk with your health care provider if you are feeling sad or have thoughts of hurting yourself. Lifestyle Do not use hot tubs, steam rooms, or  saunas. Do not douche. Do not use tampons or scented sanitary pads. Do not use herbal remedies, alcohol, illegal drugs, or medicines that are not approved by your health care provider. Chemicals in these products can harm your baby. Do not use any products that contain nicotine or tobacco, such as cigarettes, e-cigarettes, and chewing tobacco. If you need help quitting, ask your health care provider. Avoid cat litter boxes and soil used by cats. These carry germs that can cause birth defects in the baby and possibly loss of the unborn baby (fetus) by miscarriage or stillbirth. General instructions During routine prenatal visits in the first trimester, your health care provider will do a physical exam, perform necessary tests, and ask you how things are going. Keep all follow-up visits. This is important. Ask for help if you have counseling or nutritional needs during pregnancy. Your health care provider can offer advice or refer you to specialists for help with various needs. Schedule a dentist appointment. At home, brush your teeth with a soft toothbrush. Floss gently. Write down your questions. Take them to your prenatal visits. Where to find more information American Pregnancy Association: americanpregnancy.org [employer]: https://www.todd-brady.net/ Office on [employer] Health: MightyReward.co.nz Contact a health care provider if you have: Dizziness. A fever. Mild pelvic cramps, pelvic pressure, or nagging pain in the abdominal area. Nausea, vomiting, or diarrhea that lasts for 24 hours or longer. A bad-smelling vaginal discharge. Pain when you urinate. Known exposure to a contagious illness, such as chickenpox, measles, Zika virus, HIV, or hepatitis. Get help right away if you have: Spotting or bleeding from your vagina. Severe abdominal cramping or pain. Shortness of breath or chest pain. Any kind of trauma, such as from a fall  or a car crash. New or increased pain, swelling, or redness in an arm or leg. Summary The first trimester of pregnancy starts on the first day of your last menstrual period until the end of week 12 (months 1 through 3). Eating 4 or 5 small meals a day rather than 3 large meals may help to relieve nausea and vomiting. Do not use any products that contain nicotine or tobacco, such as cigarettes, e-cigarettes, and chewing tobacco. If you need help quitting, ask your health care provider. Keep all follow-up visits. This is important. This information is not intended to replace advice given to you by your health care provider. Make sure you discuss any questions you have with your healthcare provider. Document Revised: 02/28/2020 Document Reviewed: 01/04/2020 Elsevier Patient Education  2022 [employer].  Second Trimester of Pregnancy  The second trimester of pregnancy is from week 13 through week 27. This is months 4 through 6 of pregnancy. The second trimester is often a time when you feel your best. Your body has adjusted to being pregnant, and you begin to feelbetter physically. During the second trimester: Morning sickness has lessened or stopped completely. You may have more energy. You may have an increase in appetite. The second trimester is also a time when the unborn baby (fetus) is growing rapidly. At the end  of the sixth month, the fetus may be up to 12 inches long and weigh about 1 pounds. You will likely begin to feel the baby move (quickening) between 16 and 20 weeks of pregnancy. Body changes during your second trimester Your body continues to go through many changes during your second trimester.The changes vary and generally return to normal after the baby is born. Physical changes Your weight will continue to increase. You will notice your lower abdomen bulging out. You may begin to get stretch marks on your hips, abdomen, and breasts. Your breasts will continue to grow and to  become tender. Dark spots or blotches (chloasma or mask of pregnancy) may develop on your face. A dark line from your belly button to the pubic area (linea nigra) may appear. You may have changes in your hair. These can include thickening of your hair, rapid growth, and changes in texture. Some people also have hair loss during or after pregnancy, or hair that feels dry or thin. Health changes You may develop headaches. You may have heartburn. You may develop constipation. You may develop hemorrhoids or swollen, bulging veins (varicose veins). Your gums may bleed and may be sensitive to brushing and flossing. You may urinate more often because the fetus is pressing on your bladder. You may have back pain. This is caused by: Weight gain. Pregnancy hormones that are relaxing the joints in your pelvis. A shift in weight and the muscles that support your balance. Follow these instructions at home: Medicines Follow your health care provider's instructions regarding medicine use. Specific medicines may be either safe or unsafe to take during pregnancy. Do not take any medicines unless approved by your health care provider. Take a prenatal vitamin that contains at least 600 micrograms (mcg) of folic acid. Eating and drinking Eat a healthy diet that includes fresh fruits and vegetables, whole grains, good sources of protein such as meat, eggs, or tofu, and low-fat dairy products. Avoid raw meat and unpasteurized juice, milk, and cheese. These carry germs that can harm you and your baby. You may need to take these actions to prevent or treat constipation: Drink enough fluid to keep your urine pale yellow. Eat foods that are high in fiber, such as beans, whole grains, and fresh fruits and vegetables. Limit foods that are high in fat and processed sugars, such as fried or sweet foods. Activity Exercise only as directed by your health care provider. Most people can continue their usual exercise  routine during pregnancy. Try to exercise for 30 minutes at least 5 days a week. Stop exercising if you develop contractions in your uterus. Stop exercising if you develop pain or cramping in the lower abdomen or lower back. Avoid exercising if it is very hot or humid or if you are at a high altitude. Avoid heavy lifting. If you choose to, you may have sex unless your health care provider tells you not to. Relieving pain and discomfort Wear a supportive bra to prevent discomfort from breast tenderness. Take warm sitz baths to soothe any pain or discomfort caused by hemorrhoids. Use hemorrhoid cream if your health care provider approves. Rest with your legs raised (elevated) if you have leg cramps or low back pain. If you develop varicose veins: Wear support hose as told by your health care provider. Elevate your feet for 15 minutes, 3-4 times a day. Limit salt in your diet. Safety Wear your seat belt at all times when driving or riding in a car. Talk with your  health care provider if someone is verbally or physically abusive to you. Lifestyle Do not use hot tubs, steam rooms, or saunas. Do not douche. Do not use tampons or scented sanitary pads. Avoid cat litter boxes and soil used by cats. These carry germs that can cause birth defects in the baby and possibly loss of the fetus by miscarriage or stillbirth. Do not use herbal remedies, alcohol, illegal drugs, or medicines that are not approved by your health care provider. Chemicals in these products can harm your baby. Do not use any products that contain nicotine or tobacco, such as cigarettes, e-cigarettes, and chewing tobacco. If you need help quitting, ask your health care provider. General instructions During a routine prenatal visit, your health care provider will do a physical exam and other tests. He or she will also discuss your overall health. Keep all follow-up visits. This is important. Ask your health care provider for a  referral to a local prenatal education class. Ask for help if you have counseling or nutritional needs during pregnancy. Your health care provider can offer advice or refer you to specialists for help with various needs. Where to find more information American Pregnancy Association: americanpregnancy.org [employer]ologists: https://www.todd-brady.net/ Office on [employer] Health: MightyReward.co.nz Contact a health care provider if you have: A headache that does not go away when you take medicine. Vision changes or you see spots in front of your eyes. Mild pelvic cramps, pelvic pressure, or nagging pain in the abdominal area. Persistent nausea, vomiting, or diarrhea. A bad-smelling vaginal discharge or foul-smelling urine. Pain when you urinate. Sudden or extreme swelling of your face, hands, ankles, feet, or legs. A fever. Get help right away if you: Have fluid leaking from your vagina. Have spotting or bleeding from your vagina. Have severe abdominal cramping or pain. Have difficulty breathing. Have chest pain. Have fainting spells. Have not felt your baby move for the time period told by your health care provider. Have new or increased pain, swelling, or redness in an arm or leg. Summary The second trimester of pregnancy is from week 13 through week 27 (months 4 through 6). Do not use herbal remedies, alcohol, illegal drugs, or medicines that are not approved by your health care provider. Chemicals in these products can harm your baby. Exercise only as directed by your health care provider. Most people can continue their usual exercise routine during pregnancy. Keep all follow-up visits. This is important. This information is not intended to replace advice given to you by your health care provider. Make sure you discuss any questions you have with your healthcare provider. Document Revised: 02/28/2020 Document Reviewed:  01/04/2020 Elsevier Patient Education  2022 [employer].  Contraception Choices Contraception, also called birth control, refers to methods or devices thatprevent pregnancy. Hormonal methods  Contraceptive implant A contraceptive implant is a thin, plastic tube that contains a hormone that prevents pregnancy. It is different from an intrauterine device (IUD). It is inserted into the upper part of the arm by a health care provider. Implants canbe effective for up to 3 years. Progestin-only injections Progestin-only injections are injections of progestin, a synthetic form of thehormone progesterone. They are given every 3 months by a health care provider. Birth control pills Birth control pills are pills that contain hormones that prevent pregnancy. They must be taken once a day, preferably at the same time each day. Aprescription is needed to use this method of contraception. Birth control patch The birth control patch contains  hormones that prevent pregnancy. It is placed on the skin and must be changed once a week for three weeks and removed on thefourth week. A prescription is needed to use this method of contraception. Vaginal ring A vaginal ring contains hormones that prevent pregnancy. It is placed in the vagina for three weeks and removed on the fourth week. After that, the process is repeated with a new ring. A prescription is needed to use this method ofcontraception. Emergency contraceptive Emergency contraceptives prevent pregnancy after unprotected sex. They come in pill form and can be taken up to 5 days after sex. They work best the sooner they are taken after having sex. Most emergency contraceptives are available without a prescription. This method should not be used as your only form ofbirth control. Barrier methods  Female condom A female condom is a thin sheath that is worn over the penis during sex. Condoms keep sperm from going inside a woman's body. They can be used with a  sperm-killing substance (spermicide) to increase their effectiveness. They should be thrown away after one use. Female condom A female condom is a soft, loose-fitting sheath that is put into the vagina before sex. The condom keeps sperm from going inside a woman's body. Theyshould be thrown away after one use. Diaphragm A diaphragm is a soft, dome-shaped barrier. It is inserted into the vagina before sex, along with a spermicide. The diaphragm blocks sperm from entering the uterus, and the spermicide kills sperm. A diaphragm should be left in thevagina for 6-8 hours after sex and removed within 24 hours. A diaphragm is prescribed and fitted by a health care provider. A diaphragm should be replaced every 1-2 years, after giving birth, after gaining more than15 lb (6.8 kg), and after pelvic surgery. Cervical cap A cervical cap is a round, soft latex or plastic cup that fits over the cervix. It is inserted into the vagina before sex, along with spermicide. It blocks sperm from entering the uterus. The cap should be left in place for 6-8 hours after sex and removed within 48 hours. A cervical cap must be prescribed andfitted by a health care provider. It should be replaced every 2 years. Sponge A sponge is a soft, circular piece of polyurethane foam with spermicide in it. The sponge helps block sperm from entering the uterus, and the spermicide kills sperm. To use it, you make it wet and then insert it into the vagina. It should be inserted before sex, left in for at least 6 hours after sex, and removed andthrown away within 30 hours. Spermicides Spermicides are chemicals that kill or block sperm from entering the cervix and uterus. They can come as a cream, jelly, suppository, foam, or tablet. A spermicide should be inserted into the vagina with an applicator at least 10-15 minutes before sex to allow time for it to work. The process must be repeatedevery time you have sex. Spermicides do not require a  prescription. Intrauterine contraception Intrauterine device (IUD) An IUD is a T-shaped device that is put in a woman's uterus. There are two types: Hormone IUD.This type contains progestin, a synthetic form of the hormone progesterone. This type can stay in place for 3-5 years. Copper IUD.This type is wrapped in copper wire. It can stay in place for 10 years. Permanent methods of contraception Female tubal ligation In this method, a woman's fallopian tubes are sealed, tied, or blocked duringsurgery to prevent eggs from traveling to the uterus. Hysteroscopic sterilization In this method, a small,  flexible insert is placed into each fallopian tube. The inserts cause scar tissue to form in the fallopian tubes and block them, so sperm cannot reach an egg. The procedure takes about 3 months to be effective.Another form of birth control must be used during those 3 months. Female sterilization This is a procedure to tie off the tubes that carry sperm (vasectomy). After the procedure, the man can still ejaculate fluid (semen). Another form of birth control must be used for 3 months after the procedure. Natural planning methods Natural family planning In this method, a couple does not have sex on days when the woman could become pregnant. Calendar method In this method, the woman keeps track of the length of each menstrual cycle, identifies the days when pregnancy can happen, and does not have sex on those days. Ovulation method In this method, a couple avoids sex during ovulation. Symptothermal method This method involves not having sex during ovulation. The woman typically checks for ovulation bywatching changes in her temperature and in the consistency of cervical mucus. Post-ovulation method In this method, a couple waits to have sex until after ovulation. Where to find more information Centers for Disease Control and Prevention: FootballExhibition.com.br Summary Contraception, also called birth control,  refers to methods or devices that prevent pregnancy. Hormonal methods of contraception include implants, injections, pills, patches, vaginal rings, and emergency contraceptives. Barrier methods of contraception can include female condoms, female condoms, diaphragms, cervical caps, sponges, and spermicides. There are two types of IUDs (intrauterine devices). An IUD can be put in a woman's uterus to prevent pregnancy for 3-5 years. Permanent sterilization can be done through a procedure for males and females. Natural family planning methods involve nothaving sex on days when the woman could become pregnant. This information is not intended to replace advice given to you by your health care provider. Make sure you discuss any questions you have with your healthcare provider. Document Revised: 02/26/2020 Document Reviewed: 02/26/2020 Elsevier Patient Education  2022 [employer].    Considering [place]? Guide for patients at [facility] [employer] [facility]) Why consider waterbirth? Gentle birth for babies  Less pain medicine used in labor  May allow for passive descent/less pushing  May reduce perineal tears  More mobility and instinctive maternal position changes  Increased maternal relaxation   Is waterbirth safe? What are the risks of infection, drowning or other complications? Infection:  Very low risk (3.7 % for tub vs 4.8% for bed)  7 in 8000 waterbirths with documented infection  Poorly cleaned equipment most common cause  Slightly lower group B strep transmission rate  Drowning  Maternal:  Very low risk  Related to seizures or fainting  Newborn:  Very low risk. No evidence of increased risk of respiratory problems in multiple large studies  Physiological protection from breathing under water  Avoid underwater birth if there are any fetal complications  Once baby's head is out of the water, keep it out.  Birth complication  Some reports of cord trauma, but risk decreased  by bringing baby to surface gradually  No evidence of increased risk of shoulder dystocia. Mothers can usually change positions faster in water than in a bed, possibly aiding the maneuvers to free the shoulder.   There are 2 things you MUST do to have a waterbirth with [facility]: Attend a waterbirth class at [employer] [facility] at [facility]   3rd Wednesday of every month from 7-9 pm (virtual during COVID) [employer] at [email or web address] or  [email or web address] or by calling [phone] Bring [place] the certificate from the class to your prenatal appointment or send via MyChart Meet with a midwife at 36 weeks* to see if you can still plan a waterbirth and to sign the consent.   *We also recommend that you schedule as many of your prenatal visits with a midwife as possible.    Helpful information: You may want to bring a bathing suit top to the hospital to wear during labor but this is optional.  All other supplies are provided by the hospital. Please arrive at the hospital with signs of active labor, and do not wait at home until late in labor. It takes 45 min- 2 hours for COVID testing, fetal monitoring, and check in to your room to take place, plus transport and filling of the waterbirth tub.    Things that would prevent you from having a waterbirth: Unknown or Positive COVID-19 diagnosis upon admission to hospital* Premature, <37wks  Previous cesarean birth  Presence of thick meconium-stained fluid  Multiple gestation (Twins, triplets, etc.)  Uncontrolled diabetes or gestational diabetes requiring medication  Hypertension diagnosed in pregnancy or preexisting hypertension (gestational hypertension, preeclampsia, or chronic hypertension) Fetal growth restriction (your baby measures less than 10th percentile on ultrasound) Heavy vaginal bleeding  Non-reassuring fetal heart rate  Active infection (MRSA, etc.). Group B Strep is NOT a contraindication for waterbirth.   If your labor has to be induced and induction method requires continuous monitoring of the baby's heart rate  Other risks/issues identified by your obstetrical provider   Please remember that birth is unpredictable. Under certain unforeseeable circumstances your provider may advise against giving birth in the tub. These decisions will be made on a case-by-case basis and with the safety of you and your baby as our highest priority.   *Please remember that in order to have a waterbirth, you must test Negative to COVID-19 upon admission to the hospital.  Updated 01/13/21

## 2021-05-08 NOTE — Progress Notes (Signed)
DATING AND VIABILITY SONOGRAM   MADALYNN PICKELSIMER is a 25 y.o. year old G1P0000 with LMP Patient's last menstrual period was 02/23/2021. which would correlate to  [redacted]w[redacted]d weeks gestation.  She has regular menstrual cycles.   She is here today for a confirmatory initial sonogram.    GESTATION: SINGLETON-yes     FETAL ACTIVITY:          Heart rate         169          The fetus is active.         ADNEXA: The ovaries are normal.with a Lt CL noted   GESTATIONAL AGE AND  BIOMETRICS:  Gestational criteria: Estimated Date of Delivery: 11/30/21 by LMP now at [redacted]w[redacted]d  Previous Scans:0      CROWN RUMP LENGTH           38.24 mm         10 weeks5d                                                                               AVERAGE EGA(BY THIS SCAN):  10 weeks 5d  WORKING EDD( LMP ):  11/30/20     TECHNICIAN COMMENTS:  Single IUP with FHT of 169 BPM and CRL is consistent with LMP   A copy of this report including all images has been saved and backed up to a second source for retrieval if needed. All measures and details of the anatomical scan, placentation, fluid volume and pelvic anatomy are contained in that report.  Granville Lewis 05/08/2021 1:26 PM

## 2021-05-08 NOTE — Progress Notes (Signed)
  Subjective:    Shelby Carr is a G1P0000 [redacted]w[redacted]d being seen today for her first obstetrical visit.  Her obstetrical history is significant for first pregnancy. Patient is a Systems developer. Patient does intend to breast feed. Pregnancy history fully reviewed.  Patient reports no complaints.  Vitals:   05/08/21 1305  BP: 127/72  Pulse: (!) 124  Weight: 133 lb (60.3 kg)    HISTORY: OB History  Gravida Para Term Preterm AB Living  1 0 0 0 0 0  SAB IAB Ectopic Multiple Live Births  0 0 0 0 0    # Outcome Date GA Lbr Len/2nd Weight Sex Delivery Anes PTL Lv  1 Current            Past Medical History:  Diagnosis Date   SVT (supraventricular tachycardia) (HCC)    Past Surgical History:  Procedure Laterality Date   TONSILLECTOMY AND ADENOIDECTOMY  2000   Family History  Problem Relation Age of Onset   Alcohol abuse Maternal Grandmother    Heart attack Paternal Grandfather    Hypertension Paternal Grandfather    Hyperlipidemia Maternal Grandfather      Exam    Uterus:     Pelvic Exam:    Perineum: No Hemorrhoids, Normal Perineum   Vulva: normal   Vagina:  normal mucosa, normal discharge   pH:    Cervix: nulliparous appearance and closed and long   Adnexa: no mass, fullness, tenderness   Bony Pelvis: gynecoid  System: Breast:  normal appearance, no masses or tenderness   Skin: normal coloration and turgor, no rashes    Neurologic: oriented, no focal deficits   Extremities: normal strength, tone, and muscle mass   HEENT extra ocular movement intact   Mouth/Teeth mucous membranes moist, pharynx normal without lesions and dental hygiene good   Neck supple and no masses   Cardiovascular: regular rate and rhythm   Respiratory:  appears well, vitals normal, no respiratory distress, acyanotic, normal RR, chest clear, no wheezing, crepitations, rhonchi, normal symmetric air entry   Abdomen: soft, non-tender; bowel sounds normal; no masses,  no organomegaly   Urinary:        Assessment:    Pregnancy: G1P0000 Patient Active Problem List   Diagnosis Date Noted   Supervision of normal pregnancy 05/05/2021   Birth control counseling 02/26/2021   SVT (supraventricular tachycardia) (HCC) 04/04/2013        Plan:     Initial labs drawn. Prenatal vitamins. Problem list reviewed and updated. Genetic Screening discussed : panorama ordered.  Ultrasound discussed; fetal survey: ordered. Patient is interested in waterbirth  Follow up in 4 weeks. 50% of 30 min visit spent on counseling and coordination of care.     [name] 05/08/2021

## 2021-05-09 LAB — OBSTETRIC PANEL
Absolute Monocytes: 462 cells/uL (ref 200–950)
Antibody Screen: NOT DETECTED
Basophils Absolute: 41 cells/uL (ref 0–200)
Basophils Relative: 0.6 %
Eosinophils Absolute: 48 cells/uL (ref 15–500)
Eosinophils Relative: 0.7 %
HCT: 30.6 % — ABNORMAL LOW (ref 35.0–45.0)
Hemoglobin: 8.9 g/dL — ABNORMAL LOW (ref 11.7–15.5)
Hepatitis B Surface Ag: NONREACTIVE
Lymphs Abs: 1725 cells/uL (ref 850–3900)
MCH: 20 pg — ABNORMAL LOW (ref 27.0–33.0)
MCHC: 29.1 g/dL — ABNORMAL LOW (ref 32.0–36.0)
MCV: 68.6 fL — ABNORMAL LOW (ref 80.0–100.0)
MPV: 10.4 fL (ref 7.5–12.5)
Monocytes Relative: 6.7 %
Neutro Abs: 4623 cells/uL (ref 1500–7800)
Neutrophils Relative %: 67 %
Platelets: 271 10*3/uL (ref 140–400)
RBC: 4.46 10*6/uL (ref 3.80–5.10)
RDW: 16.8 % — ABNORMAL HIGH (ref 11.0–15.0)
RPR Ser Ql: NONREACTIVE
Rubella: 1.51 Index
Total Lymphocyte: 25 %
WBC: 6.9 10*3/uL (ref 3.8–10.8)

## 2021-05-09 LAB — HEPATITIS C ANTIBODY
Hepatitis C Ab: NONREACTIVE
SIGNAL TO CUT-OFF: 0.01 (ref <1.00)

## 2021-05-09 LAB — HIV ANTIBODY (ROUTINE TESTING W REFLEX): HIV 1&2 Ab, 4th Generation: NONREACTIVE

## 2021-05-10 DIAGNOSIS — O99011 Anemia complicating pregnancy, first trimester: Secondary | ICD-10-CM | POA: Insufficient documentation

## 2021-05-10 DIAGNOSIS — O99013 Anemia complicating pregnancy, third trimester: Secondary | ICD-10-CM | POA: Insufficient documentation

## 2021-05-10 MED ORDER — FERROUS SULFATE 325 (65 FE) MG PO TABS: 325.0000 mg | ORAL_TABLET | ORAL | 3 refills | Status: DC

## 2021-05-10 NOTE — Addendum Note (Signed)
Addended by: Jaynie Collins A on: 05/10/2021 06:21 PM   Modules accepted: Orders

## 2021-05-13 LAB — URINE CULTURE, OB REFLEX

## 2021-05-13 LAB — CULTURE, OB URINE

## 2021-05-14 LAB — CYTOLOGY - PAP
Chlamydia: NEGATIVE
Comment: NEGATIVE
Comment: NEGATIVE
Comment: NORMAL
Diagnosis: UNDETERMINED — AB
High risk HPV: NEGATIVE
Neisseria Gonorrhea: NEGATIVE

## 2021-06-05 ENCOUNTER — Encounter: Payer: BC Managed Care – PPO | Admitting: Obstetrics and Gynecology

## 2021-06-16 ENCOUNTER — Telehealth: Payer: Self-pay

## 2021-06-16 ENCOUNTER — Ambulatory Visit (INDEPENDENT_AMBULATORY_CARE_PROVIDER_SITE_OTHER): Payer: BC Managed Care – PPO | Admitting: Obstetrics and Gynecology

## 2021-06-16 ENCOUNTER — Observation Stay (HOSPITAL_COMMUNITY)
Admission: AD | Admit: 2021-06-16 | Discharge: 2021-06-17 | Disposition: A | Discharge status: Home / Self Care | Payer: BC Managed Care – PPO | Attending: Obstetrics & Gynecology | Admitting: Obstetrics & Gynecology

## 2021-06-16 ENCOUNTER — Encounter (HOSPITAL_COMMUNITY): Payer: Self-pay | Admitting: Obstetrics & Gynecology

## 2021-06-16 ENCOUNTER — Inpatient Hospital Stay (HOSPITAL_COMMUNITY): Payer: BC Managed Care – PPO

## 2021-06-16 ENCOUNTER — Other Ambulatory Visit: Payer: Self-pay

## 2021-06-16 VITALS — BP 105/66 | HR 100 | Wt 132.0 lb

## 2021-06-16 DIAGNOSIS — O039 Complete or unspecified spontaneous abortion without complication: Principal | ICD-10-CM | POA: Insufficient documentation

## 2021-06-16 DIAGNOSIS — O021 Missed abortion: Secondary | ICD-10-CM

## 2021-06-16 DIAGNOSIS — Z3401 Encounter for supervision of normal first pregnancy, first trimester: Secondary | ICD-10-CM

## 2021-06-16 DIAGNOSIS — Z3A16 16 weeks gestation of pregnancy: Secondary | ICD-10-CM | POA: Diagnosis not present

## 2021-06-16 DIAGNOSIS — Z20822 Contact with and (suspected) exposure to covid-19: Secondary | ICD-10-CM | POA: Insufficient documentation

## 2021-06-16 HISTORY — DX: Anemia, unspecified: D64.9

## 2021-06-16 LAB — CBC
HCT: 32.1 % — ABNORMAL LOW (ref 36.0–46.0)
Hemoglobin: 9.5 g/dL — ABNORMAL LOW (ref 12.0–15.0)
MCH: 21 pg — ABNORMAL LOW (ref 26.0–34.0)
MCHC: 29.6 g/dL — ABNORMAL LOW (ref 30.0–36.0)
MCV: 70.9 fL — ABNORMAL LOW (ref 80.0–100.0)
Platelets: 304 10*3/uL (ref 150–400)
RBC: 4.53 MIL/uL (ref 3.87–5.11)
RDW: 19.8 % — ABNORMAL HIGH (ref 11.5–15.5)
WBC: 11.7 10*3/uL — ABNORMAL HIGH (ref 4.0–10.5)
nRBC: 0 % (ref 0.0–0.2)

## 2021-06-16 MED ORDER — MISOPROSTOL 200 MCG PO TABS
800.0000 ug | ORAL_TABLET | Freq: Once | ORAL | Status: AC
  Administered 2021-06-17: 800 ug via ORAL
  Filled 2021-06-16: qty 4

## 2021-06-16 MED ORDER — ONDANSETRON HCL 4 MG/2ML IJ SOLN
4.0000 mg | Freq: Once | INTRAMUSCULAR | Status: AC
  Administered 2021-06-16: 4 mg via INTRAVENOUS
  Filled 2021-06-16: qty 2

## 2021-06-16 MED ORDER — LACTATED RINGERS IV BOLUS
1000.0000 mL | Freq: Once | INTRAVENOUS | Status: AC
  Administered 2021-06-16: 1000 mL via INTRAVENOUS

## 2021-06-16 NOTE — Telephone Encounter (Signed)
Pt called stating that she woke up this morning and noticed a small amount of bleeding over the night. Pt denies cramping. Pt states she did have intercourse yesterday. Pt states she vomited one time yesterday. I told pt that sometimes intercourse can cause the small amount of bleeding she is experiencing. Pt was told to call the office if she starts to experiencing an increase in bleeding and/or cramping. Pt expressed understanding.

## 2021-06-16 NOTE — Progress Notes (Signed)
GYNECOLOGY OFFICE VISIT NOTE  History:   Shelby Carr is a 25 y.o. G1P0010 here today for vaginal bleeding in pregnancy. She had not yet had prenatal care - she had to be rescheduled from her initial visit. She is now 16 weeks by dates but had not had any evaluation in the pregnancy. She also didn't feel like she was "growing". This pregnancy was a surprise.   She denies any abnormal vaginal discharge, pelvic pain or other concerns.     Past Medical History:  Diagnosis Date   SVT (supraventricular tachycardia) (HCC)     Past Surgical History:  Procedure Laterality Date   TONSILLECTOMY AND ADENOIDECTOMY  2000    The following portions of the patient's history were reviewed and updated as appropriate: allergies, current medications, past family history, past medical history, past social history, past surgical history and problem list.    Review of Systems:  Pertinent items noted in HPI and remainder of comprehensive ROS otherwise negative.  Physical Exam:  BP 105/66   Pulse 100   Wt 132 lb (59.9 kg)   LMP 02/23/2021   BMI 21.97 kg/m  CONSTITUTIONAL: Well-developed, well-nourished female in no acute distress.  HEENT:  Normocephalic, atraumatic. External right and left ear normal. No scleral icterus.  NECK: Normal range of motion, supple, no masses noted on observation SKIN: No rash noted. Not diaphoretic. No erythema. No pallor. MUSCULOSKELETAL: Normal range of motion. No edema noted. NEUROLOGIC: Alert and oriented to person, place, and time. Normal muscle tone coordination. No cranial nerve deficit noted. PSYCHIATRIC: Normal mood and affect. Normal behavior. Normal judgment and thought content.  CARDIOVASCULAR: Normal heart rate noted RESPIRATORY: Effort and breath sounds normal, no problems with respiration noted ABDOMEN: No masses noted. No other overt distention noted.    PELVIC: Deferred  Labs and Imaging She is Rh positive.  Bedside US performed by Stevie Kern, RN and then subsequently by me. No FHT by either of our scan c/w fetal demise. CRL was c/w [redacted]w[redacted]d making it definitive.   Assessment and Plan:    1. Missed abortion - Discussed options: expectant management, misoprostol and D&E. Discussed the risks and benefits to each.   - We discussed dosing and process of miso: Discussed normal bleeding and cramping with the medication. Discussed pain medication often times needed when medically induced for missed abortion. Discussed low risk of success given GA.  - We discussed genetics: we reviewed the benefits - she  will consider. Handout given on Anora. - Risks of surgery include but are not limited to: bleeding, infection, injury to surrounding organs/tissues (i.e. bowel/bladder/ureters), need for additional procedures, wound complications, hospital re-admission, and conversion to open surgery - We discussed postop restrictions, precautions and expectations. We discussed typical hospital course and stay.  - She would like to consider her options at home. She then went home and called the office and would like surgical management of her pregnancy. We had discussed recovery and limitations following the surgery.   - Discussed common causes of SAB. Reassured her this was nothing in her control.  - Discussed plan for subsequent pregnancy I.e. early [place] for reassurance  - Discussed that she should wait for one normal cycle and then they may begin trying again if she feels emotionally ready as well but we also discussed birth control options since this was unplanned.  - Answered all questions   Routine preventative health maintenance measures emphasized. Please refer to After Visit Summary for other counseling recommendations.  No follow-ups on file.    I spent 25 minutes dedicated to the care of this patient including pre-visit review of records, face to face time with the patient discussing her conditions and treatments and post visit  orders.    Milas Hock, MD, FACOG Obstetrician & Gynecologist, [facility] for [facility], [facility]

## 2021-06-16 NOTE — MAU Note (Signed)
Head of bed up 30 degrees. BP 96/58 P116 R17

## 2021-06-16 NOTE — MAU Note (Signed)
Ultrasound tech at bedside

## 2021-06-16 NOTE — MAU Note (Signed)
Pt assisted up to bathroom with CNM-pt became light headed and dizzy and was assisted back to bed.

## 2021-06-16 NOTE — MAU Note (Signed)
Pt reports she was dx with miscarriage today at her appointment only 10 weeks on U/S with no heartbeat.  Started bleeding tonight. Went to [facility] and thought she felt she saw the baby head come out. Assisted pt from car to w/c to room. Small to moderate amount of vag bleeding noted. No fetus noted at this time with SSE by J.Walker. V/SS. Bleeding stable at this time.

## 2021-06-17 DIAGNOSIS — O039 Complete or unspecified spontaneous abortion without complication: Secondary | ICD-10-CM | POA: Diagnosis not present

## 2021-06-17 LAB — BLOOD PRODUCT ORDER (VERBAL) VERIFICATION

## 2021-06-17 LAB — CBC
HCT: 22.2 % — ABNORMAL LOW (ref 36.0–46.0)
Hemoglobin: 7.2 g/dL — ABNORMAL LOW (ref 12.0–15.0)
MCH: 23.9 pg — ABNORMAL LOW (ref 26.0–34.0)
MCHC: 32.4 g/dL (ref 30.0–36.0)
MCV: 73.8 fL — ABNORMAL LOW (ref 80.0–100.0)
Platelets: 232 10*3/uL (ref 150–400)
RBC: 3.01 MIL/uL — ABNORMAL LOW (ref 3.87–5.11)
RDW: 20.3 % — ABNORMAL HIGH (ref 11.5–15.5)
WBC: 11 10*3/uL — ABNORMAL HIGH (ref 4.0–10.5)
nRBC: 0 % (ref 0.0–0.2)

## 2021-06-17 LAB — HEMOGLOBIN AND HEMATOCRIT, BLOOD
HCT: 21.4 % — ABNORMAL LOW (ref 36.0–46.0)
Hemoglobin: 6.5 g/dL — CL (ref 12.0–15.0)

## 2021-06-17 LAB — PREPARE RBC (CROSSMATCH)

## 2021-06-17 LAB — ABO/RH: ABO/RH(D): A POS

## 2021-06-17 LAB — RESP PANEL BY RT-PCR (FLU A&B, COVID) ARPGX2
Influenza A by PCR: NEGATIVE
Influenza B by PCR: NEGATIVE
SARS Coronavirus 2 by RT PCR: NEGATIVE

## 2021-06-17 MED ORDER — ACETAMINOPHEN 325 MG PO TABS
650.0000 mg | ORAL_TABLET | ORAL | Status: DC | PRN
  Administered 2021-06-17: 650 mg via ORAL
  Filled 2021-06-17: qty 2

## 2021-06-17 MED ORDER — ZOLPIDEM TARTRATE 5 MG PO TABS: 5.0000 mg | ORAL_TABLET | Freq: Every evening | ORAL | Status: DC | PRN

## 2021-06-17 MED ORDER — KETOROLAC TROMETHAMINE 30 MG/ML IJ SOLN
30.0000 mg | Freq: Once | INTRAMUSCULAR | Status: AC
  Administered 2021-06-17: 30 mg via INTRAVENOUS
  Filled 2021-06-17: qty 1

## 2021-06-17 MED ORDER — HYDROMORPHONE HCL 1 MG/ML IJ SOLN
1.0000 mg | INTRAMUSCULAR | Status: DC | PRN
  Administered 2021-06-17: 1 mg via INTRAVENOUS
  Filled 2021-06-17: qty 1

## 2021-06-17 MED ORDER — SODIUM CHLORIDE 0.9 % IV BOLUS
1000.0000 mL | Freq: Once | INTRAVENOUS | Status: AC
  Administered 2021-06-17: 1000 mL via INTRAVENOUS

## 2021-06-17 MED ORDER — HYDROMORPHONE HCL 1 MG/ML IJ SOLN
0.5000 mg | Freq: Once | INTRAMUSCULAR | Status: AC
  Administered 2021-06-17: 0.5 mg via INTRAVENOUS
  Filled 2021-06-17: qty 1

## 2021-06-17 MED ORDER — SODIUM CHLORIDE 0.9% IV SOLUTION: Freq: Once | INTRAVENOUS | Status: DC

## 2021-06-17 MED ORDER — HYDROCODONE-ACETAMINOPHEN 5-325 MG PO TABS: 1.0000 | ORAL_TABLET | Freq: Four times a day (QID) | ORAL | 0 refills | Status: DC | PRN

## 2021-06-17 MED ORDER — CALCIUM CARBONATE ANTACID 500 MG PO CHEW: 2.0000 | CHEWABLE_TABLET | ORAL | Status: DC | PRN

## 2021-06-17 MED ORDER — LACTATED RINGERS IV BOLUS: 1000.0000 mL | Freq: Once | INTRAVENOUS | Status: DC

## 2021-06-17 MED ORDER — MISOPROSTOL 200 MCG PO TABS: 400.0000 ug | ORAL_TABLET | Freq: Three times a day (TID) | ORAL | 0 refills | Status: DC

## 2021-06-17 MED ORDER — PRENATAL MULTIVITAMIN CH: 1.0000 | ORAL_TABLET | Freq: Every day | ORAL | Status: DC

## 2021-06-17 NOTE — MAU Note (Signed)
Blood infusion began-pt tolerating HOB 30 degrees-covid swab collected-CNM at bedside-pts color improved-lips pink

## 2021-06-17 NOTE — MAU Note (Signed)
Called to room via pt- pt placed in [place]-pt increasingly more pale in color-HR137 BP90/44. Pt reports increase in uterine cramping-

## 2021-06-17 NOTE — MAU Note (Signed)
Additional evacuation of clots per CNM with speculum. Pt continues to complain of increased cramping. NS continues at bolus rate.

## 2021-06-17 NOTE — MAU Note (Signed)
Evacuation of clots with spec exam per CNM

## 2021-06-17 NOTE — H&P (Signed)
Chief Complaint:  Miscarriage   HPI: Shelby Carr is a 25 y.o. G1P0000 at 64w2dwho presents to maternity admissions reporting active miscarriage with heavy bleeding and passage of baby at home in the toilet. Had some spotting and cramping yesterday and was seen in the office today and diagnosed with a fetal demise dating around 151wks Scheduled for a D&C and sent home. Began having severe cramps this evening, then heavy bleeding and felt pressure so sat on the toilet and saw the baby's head come out. Bleeding slowed but nothing else has come out so they drove to the hospital. RN and CNM met them at the car and transported into the room.  [facility] Course[facility]  Past Medical History:  Diagnosis Date   Anemia    SVT (supraventricular tachycardia) (HCC)    SVT (supraventricular tachycardia) (HCC)    in high school treated with medication-no longer needs treatment or medications   OB History  Gravida Para Term Preterm AB Living  1 0 0 0 0 0  SAB IAB Ectopic Multiple Live Births  0 0 0 0 0    # Outcome Date GA Lbr Len/2nd Weight Sex Delivery Anes PTL Lv  1 Current            Past Surgical History:  Procedure Laterality Date   TONSILLECTOMY AND ADENOIDECTOMY  10/05/1998   WISDOM TOOTH EXTRACTION Bilateral    Family History  Problem Relation Age of Onset   Alcohol abuse Maternal Grandmother    Heart attack Paternal Grandfather    Hypertension Paternal Grandfather    Hyperlipidemia Maternal Grandfather    Social History   Tobacco Use   Smoking status: Never   Smokeless tobacco: Never  Vaping Use   Vaping Use: Never used  Substance Use Topics   Alcohol use: No   Drug use: No   No Known Allergies Medications Prior to Admission  Medication Sig Dispense Refill Last Dose   ferrous sulfate (FERROUSUL) 325 (65 FE) MG tablet Take 1 tablet (325 mg total) by mouth every other day. 30 tablet 3 06/16/2021   Prenatal Vit-Fe  Fumarate-FA (PRENATAL VITAMIN PO) Take by mouth.   06/16/2021   I have reviewed patient's Past Medical Hx, Surgical Hx, Family Hx, Social Hx, medications and allergies.   ROS:  Review of Systems  Constitutional:  Negative for fatigue and fever.  Eyes:  Negative for photophobia and visual disturbance.  Respiratory:  Negative for shortness of breath.   Cardiovascular:  Negative for chest pain.  Gastrointestinal:  Positive for abdominal pain. Negative for vomiting (one episode earlier today).  Genitourinary:  Positive for vaginal bleeding.  Musculoskeletal:  Negative for back pain.  Neurological:  Negative for dizziness, syncope and light-headedness.   Physical Exam  Patient Vitals for the past 24 hrs:  BP Temp Temp src Pulse Resp SpO2  06/17/21 0159 (!) 102/53 (!) 100.8 F (38.2 C) Axillary (!) 108 18 100 %  06/17/21 0130 110/70 (!) 100.8 F (38.2 C) Axillary (!) 128 18 (!) 89 %  06/17/21 0115 (!) 104/52 -- -- (!) 119 -- 100 %  06/17/21 0100 (!) 90/44 -- -- (!) 112 -- 99 %  06/17/21 0050 -- -- -- -- -- 100 %  06/17/21 0045 (!) 102/54 -- -- (!) 143 -- --  06/17/21 0040 -- -- -- -- -- 100 %  06/17/21 0035 -- -- -- -- -- 100 %  06/17/21 0030 (!) 90/44 -- -- (!) 137 --  100 %  06/17/21 0015 (!) 82/47 -- -- (!) 129 -- --  06/17/21 0009 (!) 89/45 -- -- (!) 120 -- --  06/17/21 0005 (!) 85/44 -- -- (!) 122 -- --  06/17/21 0000 (!) 86/38 -- -- (!) 140 -- --  06/16/21 2336 (!) 96/58 -- -- (!) 116 17 --  06/16/21 2328 (!) 101/48 -- -- (!) 115 17 --  06/16/21 2244 -- 98.6 F (37 C) Oral -- -- --  06/16/21 2240 121/72 -- -- (!) 120 18 100 %  06/16/21 2235 -- -- -- -- -- 100 %   Constitutional: Well-developed, well-nourished female in mild physical and emotional distress.  Cardiovascular: normal rate & rhythm, no murmur Respiratory: normal effort, lung sounds clear throughout GI: Abd soft, non-tender, gravid appropriate for gestational age. Pos BS x 4 MS: Extremities nontender, no edema,  normal ROM Neurologic: Alert and oriented x 4.  GU: no CVA tenderness Pelvic: Copious large clots evacuated with ring forceps, large amount of bleeding that slowed once clots were removed.    Labs: Results for orders placed or performed during the hospital encounter of 06/16/21 (from the past 24 hour(s))  CBC     Status: Abnormal   Collection Time: 06/16/21 10:57 PM  Result Value Ref Range   WBC 11.7 (H) 4.0 - 10.5 K/uL   RBC 4.53 3.87 - 5.11 MIL/uL   Hemoglobin 9.5 (L) 12.0 - 15.0 g/dL   HCT 32.1 (L) 36.0 - 46.0 %   MCV 70.9 (L) 80.0 - 100.0 fL   MCH 21.0 (L) 26.0 - 34.0 pg   MCHC 29.6 (L) 30.0 - 36.0 g/dL   RDW 19.8 (H) 11.5 - 15.5 %   Platelets 304 150 - 400 K/uL   nRBC 0.0 0.0 - 0.2 %  Type and screen     Status: None (Preliminary result)   Collection Time: 06/16/21 10:57 PM  Result Value Ref Range   ABO/RH(D) A POS    Antibody Screen NEG    Sample Expiration 06/19/2021,2359    Unit Number [id number]    Blood Component Type RED CELLS,LR    Unit division 00    Status of Unit ISSUED    Unit tag comment VERBAL ORDERS PER DR [facility]    Transfusion Status OK TO TRANSFUSE    Crossmatch Result COMPATIBLE   Hemoglobin and hematocrit, blood     Status: Abnormal   Collection Time: 06/17/21 12:53 AM  Result Value Ref Range   Hemoglobin 6.5 (LL) 12.0 - 15.0 g/dL   HCT 21.4 (L) 36.0 - 46.0 %  Prepare RBC (crossmatch)     Status: None   Collection Time: 06/17/21  1:25 AM  Result Value Ref Range   Order Confirmation      ORDER PROCESSED BY BLOOD BANK Performed at [facility], [place] [address], [place], [place] [address]   ABO/Rh     Status: None   Collection Time: 06/17/21  1:25 AM  Result Value Ref Range   ABO/RH(D)      A POS Performed at [facility] [address] [place], [place] [address]    Imaging:  [place] Transvaginal  Result Date: 06/16/2021 CLINICAL DATA:  Vaginal bleeding. EXAM: OBSTETRIC <14 WK [place] AND TRANSVAGINAL OB US TECHNIQUE: Both  transabdominal and transvaginal ultrasound examinations were performed for complete evaluation of the gestation as well as the maternal uterus, adnexal regions, and pelvic cul-de-sac. Transvaginal technique was performed to assess early pregnancy. COMPARISON:  None.  FINDINGS: Intrauterine gestational sac: None Yolk sac:  Not Visualized. Embryo:  Not Visualized. Cardiac Activity: Not Visualized. Heart Rate: N/A  bpm Maternal uterus/adnexae: The endometrium measures 3.8 cm in thickness. The right ovary measures 2.8 cm x 2.3 cm x 1.9 cm and is normal in appearance. The left ovary measures 3.0 cm x 2.1 cm x 2.5 cm and is normal in appearance. No pelvic free fluid is identified. IMPRESSION: Thickened endometrium without evidence of an intrauterine pregnancy. Correlation with follow-up pelvic ultrasound and serial beta HCG levels is recommended. Electronically Signed   By: Virgina Norfolk M.D.   On: 06/16/2021 23:09    MAU Course: Orders Placed This Encounter  Procedures   Resp Panel by RT-PCR (Flu A&B, Covid) Nasopharyngeal Swab   US OB Transvaginal   CBC   Hemoglobin and hematocrit, blood   CBC   Diet clear liquid Room service appropriate? Yes; Fluid consistency: Thin   Informed Consent Details: Physician/Practitioner Attestation; Transcribe to consent form and obtain patient signature   H & H post transfustion-  RN to place lab order with appropriate draw time   Notify physician (specify)   Vital signs   Defer vaginal exam for vaginal bleeding or PROM <37 weeks   Initiate Oral Care Protocol   Initiate Carrier Fluid Protocol   Activity as tolerated   Full code   Type and screen   Prepare RBC (crossmatch)   ABO/Rh   Place in observation (patient's expected length of stay will be less than 2 midnights)   Meds ordered this encounter  Medications   lactated ringers bolus 1,000 mL   misoprostol (CYTOTEC) tablet 800 mcg   ondansetron (ZOFRAN) injection 4 mg   ketorolac (TORADOL) 30 MG/ML  injection 30 mg   DISCONTD: lactated ringers bolus 1,000 mL   sodium chloride 0.9 % bolus 1,000 mL   HYDROmorphone (DILAUDID) injection 0.5 mg   0.9 %  sodium chloride infusion (Manually program via Guardrails IV Fluids)   acetaminophen (TYLENOL) tablet 650 mg   zolpidem (AMBIEN) tablet 5 mg   calcium carbonate (TUMS - dosed in mg elemental calcium) chewable tablet 400 mg of elemental calcium   prenatal multivitamin tablet 1 tablet   HYDROmorphone (DILAUDID) injection 1 mg   MDM: LR bolus started immediately, U/S called to bedside. Sonographer reported no baby, only large amount of endometrial tissue (69m). Initial vitals stable, report called to Dr. EElonda Huskywho recommended 8012mcytotec q3hrs. First dose to be given here and pt kept in MAU for observation and potentially discharged when stable.  2330: Pt attempted to get up to the bathroom and was only able to stand at the bedside before getting dizzy and having to lay down. Complained of being unable to hear other than static. Had her lay down and held an alcohol swab to her nose which helped her feel less dizzy. BP down to 80s-90s/40s. Normal is 90s-100s/60s per pt. Feeling more cramping, another speculum exam done and more large clots evacuated. Membranes noted coming from os, unable to be removed.  Report called to Dr. EuElonda Huskyho encouraged cytotec be given despite pt's change in vitals. IV zofran given and after about 3063m first cytotec dose given, then IV toradol for pain.  0030: Called for reassessment by RN, pt feeling severe cramping, shaky and requesting speculum exam for removal of clots. Speculum exam completed, evacuated several large clots and along with a large amount of bleeding. Able to visualize the cervix after clots removed, membranes protruding more. Attempted  to remove, still unsuccessful. Pt very pale, shaky and in pain (writhing in bed). Given 0.49m of dilaudid.  0115: critical lab value called in at 6.5. 1U PRBCs ordered,  consent obtained. MD notified.  0200: RN notified CNM that pt's temp now 100.8, RBCs running, pt looking less pale. Still shaky and feeling cramping/bleeding (severity less due to dilaudid). Able to sit up and drink juice (kept on clears until pt status more stable). Called Dr. EElonda Huskyto report new onset fever [facility]. Antenatal orders placed, pt informed and questions answered. Pt requests female provider if possible for spec exams.   Assessment & Plan: 1. Miscarriage   - Admit to antenatal for observation - Post-transfusion H&H ordered - AM CBC ordered for comparison of WBC and evaluation of need for IV antibiotics - kept on clear liquids until stable - Tylenol given for fever, PRBCs running with NS.   JGaylan Gerold CNM, MSN, ICascadeCertified Nurse Midwife, CPoca

## 2021-06-17 NOTE — Discharge Summary (Signed)
Physician Discharge Summary  Patient ID: Shelby Carr MRN: [id number] DOB/AGE: [birth year] 25 y.o.  Admit date: 06/16/2021 Discharge date: 06/17/2021  Admission Diagnoses: 10 week pregnancy loss Spontaneous loss with hemorrhage  Discharge Diagnoses:  Active Problems:   Miscarriage   Discharged Condition: stable  Hospital Course: pt presented with heavy bleeding associated with spontaneous loss.  Hemoglobin dropped from 9.5 to 6.2 symptomatic and was transfused Cytotec 800 micrograms given and bleeding improved as did cramping Transferred to [facility] for observation Sonogram revealed no POC only decidualized endometrium  This am cramping is much improved as is the bleeding Ambulated without symptoms and states she feels much much better   Consults:   Significant Diagnostic Studies: labs:   CBC Latest Ref Rng & Units 06/17/2021 06/17/2021 06/16/2021  WBC 4.0 - 10.5 K/uL 11.0(H) - 11.7(H)  Hemoglobin 12.0 - 15.0 g/dL 7.2(L) 6.5(LL) 9.5(L)  Hematocrit 36.0 - 46.0 % 22.2(L) 21.4(L) 32.1(L)  Platelets 150 - 400 K/uL 232 - 304     Treatments: IV hydration and transfusion  Discharge Exam: Blood pressure 106/62, pulse (!) 117, temperature 99.7 F (37.6 C), temperature source Oral, resp. rate 18, height 5\' 5"  (1.651 m), weight 59.9 kg, last menstrual period 02/23/2021, SpO2 100 %. General appearance: alert, cooperative, and no distress GI: soft, non-tender; bowel sounds normal; no masses,  no organomegaly  Disposition: Discharge disposition: 01-Home or Self Care       Discharge Instructions     Call MD for:  persistant nausea and vomiting   Complete by: As directed    Call MD for:  severe uncontrolled pain   Complete by: As directed    Call MD for:  temperature >100.4   Complete by: As directed    Diet - low sodium heart healthy   Complete by: As directed    Increase activity slowly   Complete by: As directed       Allergies as of 06/17/2021   No Known Allergies       Medication List     TAKE these medications    ferrous sulfate 325 (65 FE) MG tablet Commonly known as: FerrouSul Take 1 tablet (325 mg total) by mouth every other day.   HYDROcodone-acetaminophen 5-325 MG tablet Commonly known as: NORCO/VICODIN Take 1 tablet by mouth every 6 (six) hours as needed.   misoprostol 200 MCG tablet Commonly known as: Cytotec Take 2 tablets (400 mcg total) by mouth 3 (three) times daily.   PRENATAL VITAMIN PO Take by mouth.        Follow-up Information     Center for 06/19/2021 at [place]. Call on 06/23/2021.   Specialty: Obstetrics and Gynecology Why: follow up end of this week of beginning of next week with Dr 06/25/2021 information: [address], [address] [place] [phone]                Signed: [phone] 06/17/2021, 9:31 AM

## 2021-06-18 LAB — BPAM RBC
Blood Product Expiration Date: 202210052359
ISSUE DATE / TIME: 202209130116
Unit Type and Rh: 6200

## 2021-06-18 LAB — TYPE AND SCREEN
ABO/RH(D): A POS
Antibody Screen: NEGATIVE
Unit division: 0

## 2021-06-19 ENCOUNTER — Telehealth: Payer: Self-pay | Admitting: *Deleted

## 2021-06-19 NOTE — Telephone Encounter (Signed)
Pt's mother called concerned that her daughter had miscarriage 2 days ago and she now looks very pale.  Pt did have blood transfusion after the D and C. Her last Hgb was 7.2.  She said she had been taking iron every other day but hasn't since the SAB.  Encouraged pt to take FES04 BID with a stool softener and to really push the fluids.  She denies dizziness.  Informed Mom that if pt feels dizzy when standing to sit down and put her head between her legs.  If pt becomes any more symptomatic or passes out someone is to take her to the nearest ED or call 911.

## 2021-06-20 ENCOUNTER — Encounter (HOSPITAL_COMMUNITY): Payer: Self-pay | Admitting: Obstetrics and Gynecology

## 2021-06-20 ENCOUNTER — Other Ambulatory Visit: Payer: Self-pay

## 2021-06-20 ENCOUNTER — Observation Stay (HOSPITAL_COMMUNITY)
Admission: AD | Admit: 2021-06-20 | Discharge: 2021-06-21 | Disposition: A | Discharge status: Home / Self Care | Payer: BC Managed Care – PPO | Attending: Obstetrics and Gynecology | Admitting: Obstetrics and Gynecology

## 2021-06-20 DIAGNOSIS — O039 Complete or unspecified spontaneous abortion without complication: Secondary | ICD-10-CM | POA: Insufficient documentation

## 2021-06-20 DIAGNOSIS — Z20822 Contact with and (suspected) exposure to covid-19: Secondary | ICD-10-CM | POA: Diagnosis not present

## 2021-06-20 DIAGNOSIS — R5383 Other fatigue: Secondary | ICD-10-CM | POA: Diagnosis present

## 2021-06-20 DIAGNOSIS — D649 Anemia, unspecified: Secondary | ICD-10-CM | POA: Diagnosis not present

## 2021-06-20 DIAGNOSIS — Z3A00 Weeks of gestation of pregnancy not specified: Secondary | ICD-10-CM | POA: Insufficient documentation

## 2021-06-20 LAB — COMPREHENSIVE METABOLIC PANEL
ALT: 44 U/L (ref 0–44)
AST: 50 U/L — ABNORMAL HIGH (ref 15–41)
Albumin: 3.6 g/dL (ref 3.5–5.0)
Alkaline Phosphatase: 41 U/L (ref 38–126)
Anion gap: 8 (ref 5–15)
BUN: 5 mg/dL — ABNORMAL LOW (ref 6–20)
CO2: 21 mmol/L — ABNORMAL LOW (ref 22–32)
Calcium: 9 mg/dL (ref 8.9–10.3)
Chloride: 110 mmol/L (ref 98–111)
Creatinine, Ser: 0.46 mg/dL (ref 0.44–1.00)
GFR, Estimated: >60 mL/min (ref >60)
Glucose, Bld: 100 mg/dL — ABNORMAL HIGH (ref 70–99)
Potassium: 3.6 mmol/L (ref 3.5–5.1)
Sodium: 139 mmol/L (ref 135–145)
Total Bilirubin: 0.7 mg/dL (ref 0.3–1.2)
Total Protein: 6.3 g/dL — ABNORMAL LOW (ref 6.5–8.1)

## 2021-06-20 LAB — CBC WITH DIFFERENTIAL/PLATELET
Abs Immature Granulocytes: 0.06 10*3/uL (ref 0.00–0.07)
Basophils Absolute: 0 10*3/uL (ref 0.0–0.1)
Basophils Relative: 1 %
Eosinophils Absolute: 0.1 10*3/uL (ref 0.0–0.5)
Eosinophils Relative: 2 %
HCT: 22.8 % — ABNORMAL LOW (ref 36.0–46.0)
Hemoglobin: 7 g/dL — ABNORMAL LOW (ref 12.0–15.0)
Immature Granulocytes: 1 %
Lymphocytes Relative: 40 %
Lymphs Abs: 2.3 10*3/uL (ref 0.7–4.0)
MCH: 24.1 pg — ABNORMAL LOW (ref 26.0–34.0)
MCHC: 30.7 g/dL (ref 30.0–36.0)
MCV: 78.4 fL — ABNORMAL LOW (ref 80.0–100.0)
Monocytes Absolute: 0.4 10*3/uL (ref 0.1–1.0)
Monocytes Relative: 8 %
Neutro Abs: 2.8 10*3/uL (ref 1.7–7.7)
Neutrophils Relative %: 48 %
Platelets: 248 10*3/uL (ref 150–400)
RBC: 2.91 MIL/uL — ABNORMAL LOW (ref 3.87–5.11)
RDW: 22.2 % — ABNORMAL HIGH (ref 11.5–15.5)
WBC: 5.6 10*3/uL (ref 4.0–10.5)
nRBC: 0 % (ref 0.0–0.2)

## 2021-06-20 LAB — HCG, QUANTITATIVE, PREGNANCY: hCG, Beta Chain, Quant, S: 68 m[IU]/mL — ABNORMAL HIGH (ref <5)

## 2021-06-20 LAB — PREPARE RBC (CROSSMATCH)

## 2021-06-20 LAB — RESP PANEL BY RT-PCR (FLU A&B, COVID) ARPGX2
Influenza A by PCR: NEGATIVE
Influenza B by PCR: NEGATIVE
SARS Coronavirus 2 by RT PCR: NEGATIVE

## 2021-06-20 MED ORDER — ACETAMINOPHEN 325 MG PO TABS: 650.0000 mg | ORAL_TABLET | Freq: Four times a day (QID) | ORAL | Status: DC | PRN

## 2021-06-20 MED ORDER — SODIUM CHLORIDE 0.9% IV SOLUTION: Freq: Once | INTRAVENOUS | Status: AC

## 2021-06-20 MED ORDER — POLYETHYLENE GLYCOL 3350 17 G PO PACK: 17.0000 g | PACK | Freq: Every day | ORAL | Status: DC | PRN

## 2021-06-20 MED ORDER — ONDANSETRON HCL 4 MG PO TABS: 4.0000 mg | ORAL_TABLET | Freq: Four times a day (QID) | ORAL | Status: DC | PRN

## 2021-06-20 MED ORDER — IBUPROFEN 600 MG PO TABS: 600.0000 mg | ORAL_TABLET | Freq: Four times a day (QID) | ORAL | Status: DC | PRN

## 2021-06-20 MED ORDER — ONDANSETRON HCL 4 MG/2ML IJ SOLN: 4.0000 mg | Freq: Four times a day (QID) | INTRAMUSCULAR | Status: DC | PRN

## 2021-06-20 NOTE — H&P (Signed)
CSN: [id number]  Arrival date and time: 06/20/21 1725   Event Date/Time   First Provider Initiated Contact with Patient 06/20/21 1835      Chief Complaint  Patient presents with   Fatigue   HPI  Ms.Shelby Carr is a 25 y.o. female G1P0000 status post SAB then admitted for symptomatic anemia on 9/13. She was informed of Missed abortion in the office at [facility] on 9/12. She was given all options and opted to have surgery/ D&E. On 9/13 she presented to MAU with complaints of heavy bleeding with concerns of passing her 10w fetus at home. She was given blood and then subsequently admitted for observation. She reports feeling week, tired and pale. She feels her heart beating fast at times through her ears. She can only stand and move for small periods of time without becoming weak.   OB History     Gravida  1   Para  0   Term  0   Preterm  0   AB  0   Living  0      SAB  0   IAB  0   Ectopic  0   Multiple  0   Live Births  0           Past Medical History:  Diagnosis Date   Anemia    SVT (supraventricular tachycardia) (HCC)    SVT (supraventricular tachycardia) (HCC)    in high school treated with medication-no longer needs treatment or medications    Past Surgical History:  Procedure Laterality Date   TONSILLECTOMY AND ADENOIDECTOMY  10/05/1998   WISDOM TOOTH EXTRACTION Bilateral     Family History  Problem Relation Age of Onset   Healthy Mother    Healthy Father    Alcohol abuse Maternal Grandmother    Hyperlipidemia Maternal Grandfather    Heart attack Paternal Grandfather    Hypertension Paternal Grandfather     Social History   Tobacco Use   Smoking status: Never   Smokeless tobacco: Never  Vaping Use   Vaping Use: Never used  Substance Use Topics   Alcohol use: No   Drug use: No    Allergies: No Known Allergies  Medications Prior to Admission  Medication Sig Dispense Refill Last Dose   ferrous sulfate (FERROUSUL) 325 (65 FE) MG  tablet Take 1 tablet (325 mg total) by mouth every other day. 30 tablet 3 06/20/2021 at 1000   misoprostol (CYTOTEC) 200 MCG tablet Take 2 tablets (400 mcg total) by mouth 3 (three) times daily. 18 tablet 0 06/20/2021 at 1000   Prenatal Vit-Fe Fumarate-FA (PRENATAL VITAMIN PO) Take by mouth.   06/20/2021 at 0800   HYDROcodone-acetaminophen (NORCO/VICODIN) 5-325 MG tablet Take 1 tablet by mouth every 6 (six) hours as needed. 15 tablet 0    Results for orders placed or performed during the hospital encounter of 06/20/21 (from the past 48 hour(s))  CBC with Differential/Platelet     Status: Abnormal   Collection Time: 06/20/21  7:00 PM  Result Value Ref Range   WBC 5.6 4.0 - 10.5 K/uL   RBC 2.91 (L) 3.87 - 5.11 MIL/uL   Hemoglobin 7.0 (L) 12.0 - 15.0 g/dL   HCT 34.1 (L) 93.7 - 90.2 %   MCV 78.4 (L) 80.0 - 100.0 fL   MCH 24.1 (L) 26.0 - 34.0 pg   MCHC 30.7 30.0 - 36.0 g/dL   RDW 40.9 (H) 73.5 - 32.9 %   Platelets 248 150 - 400  K/uL   nRBC 0.0 0.0 - 0.2 %   Neutrophils Relative % 48 %   Neutro Abs 2.8 1.7 - 7.7 K/uL   Lymphocytes Relative 40 %   Lymphs Abs 2.3 0.7 - 4.0 K/uL   Monocytes Relative 8 %   Monocytes Absolute 0.4 0.1 - 1.0 K/uL   Eosinophils Relative 2 %   Eosinophils Absolute 0.1 0.0 - 0.5 K/uL   Basophils Relative 1 %   Basophils Absolute 0.0 0.0 - 0.1 K/uL   WBC Morphology MORPHOLOGY UNREMARKABLE    RBC Morphology MORPHOLOGY UNREMARKABLE    Smear Review MORPHOLOGY UNREMARKABLE    Immature Granulocytes 1 %   Abs Immature Granulocytes 0.06 0.00 - 0.07 K/uL    Comment: Performed at [facility], [address], [place], [place] [address]  Comprehensive metabolic panel     Status: Abnormal   Collection Time: 06/20/21  7:00 PM  Result Value Ref Range   Sodium 139 135 - 145 mmol/L   Potassium 3.6 3.5 - 5.1 mmol/L   Chloride 110 98 - 111 mmol/L   CO2 21 (L) 22 - 32 mmol/L   Glucose, Bld 100 (H) 70 - 99 mg/dL    Comment: Glucose reference range applies only to samples  taken after fasting for at least 8 hours.   BUN 5 (L) 6 - 20 mg/dL   Creatinine, Ser 1.11 0.44 - 1.00 mg/dL   Calcium 9.0 8.9 - 55.2 mg/dL   Total Protein 6.3 (L) 6.5 - 8.1 g/dL   Albumin 3.6 3.5 - 5.0 g/dL   AST 50 (H) 15 - 41 U/L   ALT 44 0 - 44 U/L   Alkaline Phosphatase 41 38 - 126 U/L   Total Bilirubin 0.7 0.3 - 1.2 mg/dL   GFR, Estimated >08 >02 mL/min    Comment: (NOTE) Calculated using the CKD-EPI Creatinine Equation (2021)    Anion gap 8 5 - 15    Comment: Performed at [facility], [address] [place], [place] [address]  Type and screen [facility]     Status: None (Preliminary result)   Collection Time: 06/20/21  7:00 PM  Result Value Ref Range   ABO/RH(D) A POS    Antibody Screen NEG    Sample Expiration      06/23/2021,2359 Performed at [facility], [address] [place], [place] [address]    Unit Number [id number]    Blood Component Type RED CELLS,LR    Unit division 00    Status of Unit ALLOCATED    Transfusion Status OK TO TRANSFUSE    Crossmatch Result Compatible    Unit Number [id number]    Blood Component Type RED CELLS,LR    Unit division 00    Status of Unit ALLOCATED    Transfusion Status OK TO TRANSFUSE    Crossmatch Result Compatible   hCG, quantitative, pregnancy     Status: Abnormal   Collection Time: 06/20/21  7:00 PM  Result Value Ref Range   hCG, Beta Chain, Quant, S 68 (H) <5 mIU/mL    Comment:          GEST. AGE      CONC.  (mIU/mL)   <=1 WEEK        5 - 50     2 WEEKS       50 - 500     3 WEEKS       100 - 10,000  4 WEEKS     1,000 - 30,000     5 WEEKS     3,500 - 115,000   6-8 WEEKS     12,000 - 270,000    12 WEEKS     15,000 - 220,000        FEMALE AND NON-PREGNANT FEMALE:     LESS THAN 5 mIU/mL Performed at [facility], [address] [place], [place] [address]   Prepare RBC (crossmatch)     Status: None   Collection Time: 06/20/21  8:02 PM  Result Value Ref Range   Order  Confirmation      ORDER PROCESSED BY BLOOD BANK Performed at [facility], [address] [place], [place] [address]     Review of Systems  Constitutional:  Positive for activity change and fatigue.  Gastrointestinal:  Negative for abdominal pain.  Genitourinary:  Positive for vaginal bleeding.  Neurological:  Positive for dizziness, weakness and light-headedness.  Physical Exam   Blood pressure 123/66, pulse 95, temperature 99.2 F (37.3 C), temperature source Oral, resp. rate 18, SpO2 100 %, unknown if currently breastfeeding.  Physical Exam Vitals and nursing note reviewed.  Constitutional:      General: She is not in acute distress.    Appearance: Normal appearance. She is not ill-appearing or toxic-appearing.  HENT:     Head: Normocephalic.  Eyes:     Pupils: Pupils are equal, round, and reactive to light.  Skin:    General: Skin is warm.     Coloration: Skin is pale. Skin is not jaundiced.     Findings: No bruising.  Neurological:     Mental Status: She is alert and oriented to person, place, and time.  Psychiatric:        Behavior: Behavior normal.  for admission for PRBC's   Assessment and Plan   A:  1. Symptomatic anemia   2. Miscarriage      P:  Admit to OBSU 2 units PRBC's Regular diet.   Duane Lope, NP 06/20/2021 8:18 PM

## 2021-06-20 NOTE — MAU Provider Note (Addendum)
History     CSN: [id number]  Arrival date and time: 06/20/21 1725   Event Date/Time   First Provider Initiated Contact with Patient 06/20/21 1835      Chief Complaint  Patient presents with   Fatigue   HPI  Ms.Shelby Carr is a 25 y.o. female G1P0000 status post SAB then admitted for symptomatic anemia on 9/13. She was informed of Missed abortion in the office at [facility] on 9/12. She was given all options and opted to have surgery/ D&E. On 9/13 she presented to MAU with complaints heavy bleeding with concerns of passing the fetus at home. She was given blood and then subsequently admitted for observation. She reports feeling week, tired and pale. She feels her heart betting at times through her ears. She can only stand and move for small periods of time without becoming weak.   OB History     Gravida  1   Para  0   Term  0   Preterm  0   AB  0   Living  0      SAB  0   IAB  0   Ectopic  0   Multiple  0   Live Births  0           Past Medical History:  Diagnosis Date   Anemia    SVT (supraventricular tachycardia) (HCC)    SVT (supraventricular tachycardia) (HCC)    in high school treated with medication-no longer needs treatment or medications    Past Surgical History:  Procedure Laterality Date   TONSILLECTOMY AND ADENOIDECTOMY  10/05/1998   WISDOM TOOTH EXTRACTION Bilateral     Family History  Problem Relation Age of Onset   Healthy Mother    Healthy Father    Alcohol abuse Maternal Grandmother    Hyperlipidemia Maternal Grandfather    Heart attack Paternal Grandfather    Hypertension Paternal Grandfather     Social History   Tobacco Use   Smoking status: Never   Smokeless tobacco: Never  Vaping Use   Vaping Use: Never used  Substance Use Topics   Alcohol use: No   Drug use: No    Allergies: No Known Allergies  Medications Prior to Admission  Medication Sig Dispense Refill Last Dose   ferrous sulfate (FERROUSUL) 325 (65 FE)  MG tablet Take 1 tablet (325 mg total) by mouth every other day. 30 tablet 3 06/20/2021 at 1000   misoprostol (CYTOTEC) 200 MCG tablet Take 2 tablets (400 mcg total) by mouth 3 (three) times daily. 18 tablet 0 06/20/2021 at 1000   Prenatal Vit-Fe Fumarate-FA (PRENATAL VITAMIN PO) Take by mouth.   06/20/2021 at 0800   HYDROcodone-acetaminophen (NORCO/VICODIN) 5-325 MG tablet Take 1 tablet by mouth every 6 (six) hours as needed. 15 tablet 0    Results for orders placed or performed during the hospital encounter of 06/20/21 (from the past 48 hour(s))  CBC with Differential/Platelet     Status: Abnormal   Collection Time: 06/20/21  7:00 PM  Result Value Ref Range   WBC 5.6 4.0 - 10.5 K/uL   RBC 2.91 (L) 3.87 - 5.11 MIL/uL   Hemoglobin 7.0 (L) 12.0 - 15.0 g/dL   HCT 32.9 (L) 92.4 - 26.8 %   MCV 78.4 (L) 80.0 - 100.0 fL   MCH 24.1 (L) 26.0 - 34.0 pg   MCHC 30.7 30.0 - 36.0 g/dL   RDW 34.1 (H) 96.2 - 22.9 %   Platelets 248 150 -  400 K/uL   nRBC 0.0 0.0 - 0.2 %   Neutrophils Relative % 48 %   Neutro Abs 2.8 1.7 - 7.7 K/uL   Lymphocytes Relative 40 %   Lymphs Abs 2.3 0.7 - 4.0 K/uL   Monocytes Relative 8 %   Monocytes Absolute 0.4 0.1 - 1.0 K/uL   Eosinophils Relative 2 %   Eosinophils Absolute 0.1 0.0 - 0.5 K/uL   Basophils Relative 1 %   Basophils Absolute 0.0 0.0 - 0.1 K/uL   WBC Morphology MORPHOLOGY UNREMARKABLE    RBC Morphology MORPHOLOGY UNREMARKABLE    Smear Review MORPHOLOGY UNREMARKABLE    Immature Granulocytes 1 %   Abs Immature Granulocytes 0.06 0.00 - 0.07 K/uL    Comment: Performed at [facility], [address], [place], [place] [address]  Comprehensive metabolic panel     Status: Abnormal   Collection Time: 06/20/21  7:00 PM  Result Value Ref Range   Sodium 139 135 - 145 mmol/L   Potassium 3.6 3.5 - 5.1 mmol/L   Chloride 110 98 - 111 mmol/L   CO2 21 (L) 22 - 32 mmol/L   Glucose, Bld 100 (H) 70 - 99 mg/dL    Comment: Glucose reference range applies only to  samples taken after fasting for at least 8 hours.   BUN 5 (L) 6 - 20 mg/dL   Creatinine, Ser 6.04 0.44 - 1.00 mg/dL   Calcium 9.0 8.9 - 54.0 mg/dL   Total Protein 6.3 (L) 6.5 - 8.1 g/dL   Albumin 3.6 3.5 - 5.0 g/dL   AST 50 (H) 15 - 41 U/L   ALT 44 0 - 44 U/L   Alkaline Phosphatase 41 38 - 126 U/L   Total Bilirubin 0.7 0.3 - 1.2 mg/dL   GFR, Estimated >98 >11 mL/min    Comment: (NOTE) Calculated using the CKD-EPI Creatinine Equation (2021)    Anion gap 8 5 - 15    Comment: Performed at [facility], [address], [place], [place] [address]  Type and screen [facility]     Status: None (Preliminary result)   Collection Time: 06/20/21  7:00 PM  Result Value Ref Range   ABO/RH(D) A POS    Antibody Screen NEG    Sample Expiration      06/23/2021,2359 Performed at [facility], [address], [place], [place] [address]    Unit Number [id number]    Blood Component Type RED CELLS,LR    Unit division 00    Status of Unit ALLOCATED    Transfusion Status OK TO TRANSFUSE    Crossmatch Result Compatible    Unit Number [id number]    Blood Component Type RED CELLS,LR    Unit division 00    Status of Unit ALLOCATED    Transfusion Status OK TO TRANSFUSE    Crossmatch Result Compatible   hCG, quantitative, pregnancy     Status: Abnormal   Collection Time: 06/20/21  7:00 PM  Result Value Ref Range   hCG, Beta Chain, Quant, S 68 (H) <5 mIU/mL    Comment:          GEST. AGE      CONC.  (mIU/mL)   <=1 WEEK        5 - 50     2 WEEKS       50 - 500     3 WEEKS       100 - 10,000  4 WEEKS     1,000 - 30,000     5 WEEKS     3,500 - 115,000   6-8 WEEKS     12,000 - 270,000    12 WEEKS     15,000 - 220,000        FEMALE AND NON-PREGNANT FEMALE:     LESS THAN 5 mIU/mL Performed at [facility], [address], [place], [place] [address]   Prepare RBC (crossmatch)     Status: None   Collection Time: 06/20/21  8:02 PM  Result Value Ref Range    Order Confirmation      ORDER PROCESSED BY BLOOD BANK Performed at [facility], [address] [place], [place] [address]     Review of Systems  Constitutional:  Positive for activity change and fatigue.  Gastrointestinal:  Negative for abdominal pain.  Genitourinary:  Positive for vaginal bleeding.  Neurological:  Positive for dizziness, weakness and light-headedness.  Physical Exam   Blood pressure 123/66, pulse 95, temperature 99.2 F (37.3 C), temperature source Oral, resp. rate 18, SpO2 100 %, unknown if currently breastfeeding.  Physical Exam Vitals and nursing note reviewed.  Constitutional:      General: She is not in acute distress.    Appearance: Normal appearance. She is not ill-appearing or toxic-appearing.  HENT:     Head: Normocephalic.  Eyes:     Pupils: Pupils are equal, round, and reactive to light.  Skin:    General: Skin is warm.     Coloration: Skin is pale. Skin is not jaundiced.     Findings: No bruising.  Neurological:     Mental Status: She is alert and oriented to person, place, and time.  Psychiatric:        Behavior: Behavior normal.   MAU Course  Procedures None  MDM  CBC & CMP Type and screen Orthostatic vitals normal- patient became dizzy with standing.  EKG normal sinus rhythm  Reviewed patient with Dr. Vergie Living; ok for admission for PRBC's   Assessment and Plan   A:  1. Symptomatic anemia   2. Miscarriage      P:  Admit to OBSU 2 units PRBC's Regular diet.   Duane Lope, NP 06/20/2021 8:18 PM

## 2021-06-20 NOTE — MAU Note (Signed)
Recent SAB. Feels very weak.  'they think she needs more iron.' Still bleeding, though not heavy. Denies pain. denies feeling dizzy.

## 2021-06-21 DIAGNOSIS — D649 Anemia, unspecified: Secondary | ICD-10-CM | POA: Diagnosis not present

## 2021-06-21 DIAGNOSIS — O039 Complete or unspecified spontaneous abortion without complication: Secondary | ICD-10-CM | POA: Diagnosis not present

## 2021-06-21 LAB — HEMOGLOBIN AND HEMATOCRIT, BLOOD
HCT: 30.7 % — ABNORMAL LOW (ref 36.0–46.0)
Hemoglobin: 9.9 g/dL — ABNORMAL LOW (ref 12.0–15.0)

## 2021-06-21 MED ORDER — POLYETHYLENE GLYCOL 3350 17 G PO PACK: 17.0000 g | PACK | Freq: Every day | ORAL | 0 refills | Status: DC | PRN

## 2021-06-21 MED ORDER — ACETAMINOPHEN 325 MG PO TABS: 650.0000 mg | ORAL_TABLET | Freq: Four times a day (QID) | ORAL | Status: DC | PRN

## 2021-06-21 MED ORDER — IBUPROFEN 600 MG PO TABS: 600.0000 mg | ORAL_TABLET | Freq: Four times a day (QID) | ORAL | 0 refills | Status: DC | PRN

## 2021-06-21 NOTE — Discharge Summary (Signed)
Gynecology Discharge Summary Date of Admission: 06/20/2021 Date of Discharge: 06/21/2021  The patient was admitted for symptomatic anemia with minimal to none VB. H/H responded well to two units. CBC Latest Ref Rng & Units 06/21/2021 06/20/2021 06/17/2021  WBC 4.0 - 10.5 K/uL - 5.6 11.0(H)  Hemoglobin 12.0 - 15.0 g/dL 7.8(G) 7.0(L) 7.2(L)  Hematocrit 36.0 - 46.0 % 30.7(L) 22.8(L) 22.2(L)  Platelets 150 - 400 K/uL - 248 232    Allergies as of 06/21/2021   No Known Allergies      Medication List     STOP taking these medications    misoprostol 200 MCG tablet Commonly known as: Cytotec       TAKE these medications    acetaminophen 325 MG tablet Commonly known as: TYLENOL Take 2 tablets (650 mg total) by mouth every 6 (six) hours as needed for mild pain, moderate pain or fever.   ferrous sulfate 325 (65 FE) MG tablet Commonly known as: FerrouSul Take 1 tablet (325 mg total) by mouth every other day.   HYDROcodone-acetaminophen 5-325 MG tablet Commonly known as: NORCO/VICODIN Take 1 tablet by mouth every 6 (six) hours as needed.   ibuprofen 600 MG tablet Commonly known as: ADVIL Take 1 tablet (600 mg total) by mouth every 6 (six) hours as needed (mild pain).   polyethylene glycol 17 g packet Commonly known as: MIRALAX / GLYCOLAX Take 17 g by mouth daily as needed for mild constipation.   PRENATAL VITAMIN PO Take by mouth.        Future Appointments  Date Time Provider Department Center  06/26/2021 10:30 AM Milas Hock, MD [facility]    Cornelia Copa MD Attending Center for [facility])

## 2021-06-21 NOTE — Plan of Care (Signed)
Patient states feeling much better. Discharge information provided to patient. Patient verbalized understanding of discharge instructions with no further questions.   Problem: Education: Goal: Knowledge of General Education information will improve Description: Including pain rating scale, medication(s)/side effects and non-pharmacologic comfort measures Outcome: Adequate for Discharge   Problem: Health Behavior/Discharge Planning: Goal: Ability to manage health-related needs will improve Outcome: Adequate for Discharge   Problem: Clinical Measurements: Goal: Ability to maintain clinical measurements within normal limits will improve Outcome: Adequate for Discharge Goal: Will remain free from infection Outcome: Adequate for Discharge Goal: Diagnostic test results will improve Outcome: Adequate for Discharge Goal: Respiratory complications will improve Outcome: Adequate for Discharge Goal: Cardiovascular complication will be avoided Outcome: Adequate for Discharge   Problem: Activity: Goal: Risk for activity intolerance will decrease Outcome: Adequate for Discharge   Problem: Nutrition: Goal: Adequate nutrition will be maintained Outcome: Adequate for Discharge   Problem: Coping: Goal: Level of anxiety will decrease Outcome: Adequate for Discharge   Problem: Elimination: Goal: Will not experience complications related to bowel motility Outcome: Adequate for Discharge Goal: Will not experience complications related to urinary retention Outcome: Adequate for Discharge   Problem: Pain Managment: Goal: General experience of comfort will improve Outcome: Adequate for Discharge   Problem: Safety: Goal: Ability to remain free from injury will improve Outcome: Adequate for Discharge   Problem: Skin Integrity: Goal: Risk for impaired skin integrity will decrease Outcome: Adequate for Discharge

## 2021-06-22 LAB — BPAM RBC
Blood Product Expiration Date: 202210102359
Blood Product Expiration Date: 202210102359
ISSUE DATE / TIME: 202209162309
ISSUE DATE / TIME: 202209170208
Unit Type and Rh: 6200
Unit Type and Rh: 6200

## 2021-06-22 LAB — TYPE AND SCREEN
ABO/RH(D): A POS
Antibody Screen: NEGATIVE
Unit division: 0
Unit division: 0

## 2021-06-26 ENCOUNTER — Ambulatory Visit: Payer: BC Managed Care – PPO | Admitting: Obstetrics and Gynecology

## 2021-07-01 NOTE — Progress Notes (Signed)
GYNECOLOGY OFFICE VISIT NOTE  History:   Shelby Carr is a 25 y.o. G1P0000 here today for follow up from miscarriage. She denies any abnormal vaginal discharge, bleeding, pelvic pain or other concerns.   She bled tapering off over about 2 weeks and has not bled since she stopped. She has felt better after her transfusion. She has continued the iron. She will continue it every other day until it is gone. She bled enough at the time of her loss that she was transfused.   The following portions of the patient's history were reviewed and updated as appropriate: allergies, current medications, past family history, past medical history, past social history, past surgical history and problem list.   Health Maintenance:  ASCUS/HPV negative 05/08/21  Review of Systems:  Pertinent items noted in HPI and remainder of comprehensive ROS otherwise negative.  Physical Exam:  BP 114/73   Pulse 91   Wt 133 lb (60.3 kg)   BMI 22.13 kg/m  CONSTITUTIONAL: Well-developed, well-nourished female in no acute distress.  HEENT:  Normocephalic, atraumatic. External right and left ear normal. No scleral icterus.  NECK: Normal range of motion, supple, no masses noted on observation SKIN: No rash noted. Not diaphoretic. No erythema. No pallor. MUSCULOSKELETAL: Normal range of motion. No edema noted. NEUROLOGIC: Alert and oriented to person, place, and time. Normal muscle tone coordination. No cranial nerve deficit noted. PSYCHIATRIC: Normal mood and affect. Normal behavior. Normal judgment and thought content.  CARDIOVASCULAR: Normal heart rate noted RESPIRATORY: Effort and breath sounds normal, no problems with respiration noted ABDOMEN: No masses noted. No other overt distention noted.    PELVIC: Deferred  Labs and Imaging No results found for this or any previous visit (from the past 168 hour(s)). US OB Transvaginal  Result Date: 06/16/2021 CLINICAL DATA:  Vaginal bleeding. EXAM: OBSTETRIC <14 WK [place]  AND TRANSVAGINAL OB US TECHNIQUE: Both transabdominal and transvaginal ultrasound examinations were performed for complete evaluation of the gestation as well as the maternal uterus, adnexal regions, and pelvic cul-de-sac. Transvaginal technique was performed to assess early pregnancy. COMPARISON:  None. FINDINGS: Intrauterine gestational sac: None Yolk sac:  Not Visualized. Embryo:  Not Visualized. Cardiac Activity: Not Visualized. Heart Rate: N/A  bpm Maternal uterus/adnexae: The endometrium measures 3.8 cm in thickness. The right ovary measures 2.8 cm x 2.3 cm x 1.9 cm and is normal in appearance. The left ovary measures 3.0 cm x 2.1 cm x 2.5 cm and is normal in appearance. No pelvic free fluid is identified. IMPRESSION: Thickened endometrium without evidence of an intrauterine pregnancy. Correlation with follow-up pelvic ultrasound and serial beta HCG levels is recommended. Electronically Signed   By: Aram Candela M.D.   On: 06/16/2021 23:09      Assessment and Plan:    1. Miscarriage - Discussed common causes of SAB. Reassured her this was nothing in her control.  - Discussed plan for subsequent pregnancy i.e. early [place] (typically around 7-8wks). Because her loss was at 10 weeks, we discussed weekly or every other week FHT for her reassurance.  - Discussed that she should wait for one normal cycle and then they may begin trying again if she feels emotionally ready as well and desires a pregnancy. She thinks she would like to wait at least 3 months, maybe longer.  - We also discussed birth control options - she would like to do OCPs.  - Answered all questions   Routine preventative health maintenance measures emphasized. Please refer to After Visit  Summary for other counseling recommendations.   No follow-ups on file.    I spent  28  minutes dedicated to the care of this patient including pre-visit review of records, face to face time with the patient discussing her conditions and treatments  and post visit orders.    Milas Hock, MD, FACOG Obstetrician & Gynecologist, [facility] for [facility], [facility]

## 2021-07-03 ENCOUNTER — Encounter: Payer: BC Managed Care – PPO | Admitting: Obstetrics and Gynecology

## 2021-07-03 ENCOUNTER — Ambulatory Visit: Payer: BC Managed Care – PPO

## 2021-07-03 ENCOUNTER — Ambulatory Visit: Payer: BC Managed Care – PPO | Admitting: Obstetrics and Gynecology

## 2021-07-03 ENCOUNTER — Other Ambulatory Visit: Payer: Self-pay

## 2021-07-03 ENCOUNTER — Encounter: Payer: Self-pay | Admitting: Obstetrics and Gynecology

## 2021-07-03 VITALS — BP 114/73 | HR 91 | Wt 133.0 lb

## 2021-07-03 DIAGNOSIS — O039 Complete or unspecified spontaneous abortion without complication: Secondary | ICD-10-CM

## 2021-07-03 DIAGNOSIS — Z3009 Encounter for other general counseling and advice on contraception: Secondary | ICD-10-CM

## 2021-07-03 MED ORDER — NORETHIN ACE-ETH ESTRAD-FE 1-20 MG-MCG PO TABS
1.0000 | ORAL_TABLET | Freq: Every day | ORAL | 3 refills | Status: DC
Start: 2021-07-03 — End: 2022-04-13

## 2022-03-04 ENCOUNTER — Encounter: Payer: Self-pay | Admitting: Obstetrics and Gynecology

## 2022-04-16 ENCOUNTER — Other Ambulatory Visit (HOSPITAL_COMMUNITY)
Admission: RE | Admit: 2022-04-16 | Discharge: 2022-04-16 | Disposition: A | Discharge status: Home / Self Care | Payer: BC Managed Care – PPO | Source: Ambulatory Visit | Attending: Obstetrics and Gynecology | Admitting: Obstetrics and Gynecology

## 2022-04-16 ENCOUNTER — Ambulatory Visit (INDEPENDENT_AMBULATORY_CARE_PROVIDER_SITE_OTHER): Payer: BC Managed Care – PPO | Admitting: Obstetrics and Gynecology

## 2022-04-16 ENCOUNTER — Encounter: Payer: Self-pay | Admitting: Obstetrics and Gynecology

## 2022-04-16 VITALS — BP 119/74 | HR 101 | Wt 141.0 lb

## 2022-04-16 DIAGNOSIS — Z3481 Encounter for supervision of other normal pregnancy, first trimester: Secondary | ICD-10-CM

## 2022-04-16 DIAGNOSIS — I471 Supraventricular tachycardia: Secondary | ICD-10-CM | POA: Diagnosis not present

## 2022-04-16 DIAGNOSIS — Z3480 Encounter for supervision of other normal pregnancy, unspecified trimester: Secondary | ICD-10-CM | POA: Diagnosis present

## 2022-04-16 DIAGNOSIS — Z3491 Encounter for supervision of normal pregnancy, unspecified, first trimester: Secondary | ICD-10-CM | POA: Insufficient documentation

## 2022-04-16 DIAGNOSIS — Z3A10 10 weeks gestation of pregnancy: Secondary | ICD-10-CM | POA: Diagnosis not present

## 2022-04-16 LAB — HEPATITIS C ANTIBODY: HCV Ab: NEGATIVE

## 2022-04-16 NOTE — Progress Notes (Signed)
History:   Shelby Carr is a 26 y.o. G2P0010 at [redacted]w[redacted]d by LMP being seen today for her first obstetrical visit.   Patient is unsure about nursing.    Pregnancy history fully reviewed. Obstetrical history is significant for likely 10 wks SAB although she found out about it at 16 weeks.   Patient reports  nausea .      HISTORY: OB History  Gravida Para Term Preterm AB Living  2 0 0 0 1 0  SAB IAB Ectopic Multiple Live Births  1 0 0 0 0    # Outcome Date GA Lbr Len/2nd Weight Sex Delivery Anes PTL Lv  2 Current           1 SAB              Last pap smear was done 05/2021 Lab Results  Component Value Date   DIAGPAP (A) 05/08/2021    - Atypical squamous cells of undetermined significance (ASC-US)   DIAGPAP  04/06/2019    NEGATIVE FOR INTRAEPITHELIAL LESIONS OR MALIGNANCY.   HPVHIGH Negative 05/08/2021     Past Medical History:  Diagnosis Date   Anemia    SVT (supraventricular tachycardia) (HCC)    SVT (supraventricular tachycardia) (HCC)    in high school treated with medication-no longer needs treatment or medications   Past Surgical History:  Procedure Laterality Date   TONSILLECTOMY AND ADENOIDECTOMY  10/05/1998   WISDOM TOOTH EXTRACTION Bilateral    Family History  Problem Relation Age of Onset   Healthy Mother    Healthy Father    Alcohol abuse Maternal Grandmother    Hyperlipidemia Maternal Grandfather    Heart attack Paternal Grandfather    Hypertension Paternal Grandfather    Social History   Tobacco Use   Smoking status: Never   Smokeless tobacco: Never  Vaping Use   Vaping Use: Never used  Substance Use Topics   Alcohol use: No   Drug use: No   No Known Allergies Current Outpatient Medications on File Prior to Visit  Medication Sig Dispense Refill   Prenatal Vit-Fe Fumarate-FA (PRENATAL VITAMIN PO) Take by mouth.     ferrous sulfate (FERROUSUL) 325 (65 FE) MG tablet Take 1 tablet (325 mg total) by mouth every other day. (Patient not  taking: Reported on 04/15/2022) 30 tablet 3   No current facility-administered medications on file prior to visit.    Review of Systems Pertinent items noted in HPI and remainder of comprehensive ROS otherwise negative.  Physical Exam:   Vitals:   04/16/22 1308  BP: 119/74  Pulse: (!) 101  Weight: 141 lb (64 kg)     Bedside Ultrasound for FHR check: Viable intrauterine pregnancy with positive cardiac activity noted, fetal heart rate 174 bpm  Patient informed that the ultrasound is considered a limited obstetric ultrasound and is not intended to be a complete ultrasound exam.  Patient also informed that the ultrasound is not being completed with the intent of assessing for fetal or placental anomalies or any pelvic abnormalities.  Explained that the purpose of today's ultrasound is to assess for fetal heart rate.  Patient acknowledges the purpose of the exam and the limitations of the study.  General: well-developed, well-nourished female in no acute distress  Breasts:  Declined  Skin: normal coloration and turgor, no rashes  Neurologic: oriented, normal, negative, normal mood  Extremities: normal strength, tone, and muscle mass, ROM of all joints is normal  HEENT PERRLA, extraocular movement intact and  sclera clear, anicteric  Neck supple and no masses  Cardiovascular: regular rate and rhythm  Respiratory:  no respiratory distress, normal breath sounds  Abdomen: soft, non-tender; bowel sounds normal; no masses,  no organomegaly  Pelvic: Declined - she preferred self swab    Assessment:    Pregnancy: G2P0010 Patient Active Problem List   Diagnosis Date Noted   SVT (supraventricular tachycardia) (HCC) 04/04/2013     Plan:    1. Supervision of other normal pregnancy, antepartum Initial labs drawn. Pap smear is up to date Continue prenatal vitamins. Problem list reviewed and updated. Genetic Screening discussed, NIPS: undecided. Ultrasound discussed; fetal anatomic survey:  ordered. Anticipatory guidance about prenatal visits given including labs, ultrasounds, and testing. Discussed usage of Babyscripts and virtual visits  - Obstetric panel - HIV antibody (with reflex) - Hepatitis C Antibody - [place] bedside; Future - Culture, OB Urine - GC/Chlamydia probe amp ([place])not at [facility] - Babyscripts Schedule Optimization - [place] MFM OB COMP + 14 WK; Future - Panorama Prenatal Test Full Panel - Horizon 4 (SMA, CF, FRAGILE X, DMD)  2.  SVT (supraventricular tachycardia) (HCC) No meds, no issues since HS   The nature of [facility] with multiple MDs and Advanced Practice Providers was explained to patient; also emphasized that residents, students are part of our team. Routine obstetric precautions reviewed. Encouraged to seek out care at office or emergency room [facility] preferred) for urgent and/or emergent concerns. Return in about 1 week (around 04/23/2022) for nurse visit for [facility] check.    Milas Hock, MD, FACOG Obstetrician & Gynecologist, [facility]

## 2022-04-16 NOTE — Progress Notes (Signed)
Bedside U/S shows single IUP with FHT of 174 BPM and CRL is consistent with LMP  GA 10w4 days

## 2022-04-17 LAB — OBSTETRIC PANEL
Absolute Monocytes: 478 cells/uL (ref 200–950)
Antibody Screen: NOT DETECTED
Basophils Absolute: 41 cells/uL (ref 0–200)
Basophils Relative: 0.5 %
Eosinophils Absolute: 57 cells/uL (ref 15–500)
Eosinophils Relative: 0.7 %
HCT: 40.5 % (ref 35.0–45.0)
Hemoglobin: 13.2 g/dL (ref 11.7–15.5)
Hepatitis B Surface Ag: NONREACTIVE
Lymphs Abs: 1798 cells/uL (ref 850–3900)
MCH: 27.2 pg (ref 27.0–33.0)
MCHC: 32.6 g/dL (ref 32.0–36.0)
MCV: 83.5 fL (ref 80.0–100.0)
MPV: 11.7 fL (ref 7.5–12.5)
Monocytes Relative: 5.9 %
Neutro Abs: 5727 cells/uL (ref 1500–7800)
Neutrophils Relative %: 70.7 %
Platelets: 238 10*3/uL (ref 140–400)
RBC: 4.85 10*6/uL (ref 3.80–5.10)
RDW: 13.9 % (ref 11.0–15.0)
RPR Ser Ql: NONREACTIVE
Rubella: 1.32 Index
Total Lymphocyte: 22.2 %
WBC: 8.1 10*3/uL (ref 3.8–10.8)

## 2022-04-17 LAB — HEPATITIS C ANTIBODY: Hepatitis C Ab: NONREACTIVE

## 2022-04-17 LAB — HIV ANTIBODY (ROUTINE TESTING W REFLEX): HIV 1&2 Ab, 4th Generation: NONREACTIVE

## 2022-04-17 LAB — GC/CHLAMYDIA PROBE AMP (~~LOC~~) NOT AT ARMC
Chlamydia: NEGATIVE
Comment: NEGATIVE
Comment: NORMAL
Neisseria Gonorrhea: NEGATIVE

## 2022-04-18 LAB — URINE CULTURE, OB REFLEX

## 2022-04-18 LAB — CULTURE, OB URINE

## 2022-04-24 ENCOUNTER — Ambulatory Visit (INDEPENDENT_AMBULATORY_CARE_PROVIDER_SITE_OTHER): Payer: BC Managed Care – PPO | Admitting: *Deleted

## 2022-04-24 DIAGNOSIS — Z3481 Encounter for supervision of other normal pregnancy, first trimester: Secondary | ICD-10-CM

## 2022-04-24 DIAGNOSIS — Z3A11 11 weeks gestation of pregnancy: Secondary | ICD-10-CM

## 2022-04-24 NOTE — Progress Notes (Cosign Needed)
FHT check today @ [redacted]w[redacted]d is 162 BPM.  Due to pt's history she is requesting another FHT check before her next ROB appt.

## 2022-05-07 ENCOUNTER — Ambulatory Visit (INDEPENDENT_AMBULATORY_CARE_PROVIDER_SITE_OTHER): Payer: BC Managed Care – PPO

## 2022-05-07 DIAGNOSIS — Z3480 Encounter for supervision of other normal pregnancy, unspecified trimester: Secondary | ICD-10-CM

## 2022-05-07 NOTE — Progress Notes (Signed)
Pt presents to office for fetal heart rate check. Pt has no complaints or concerns  at this time.   Fetal Heart Rate: 159

## 2022-05-12 NOTE — Progress Notes (Unsigned)
   PRENATAL VISIT NOTE  Subjective:  Shelby Carr is a 26 y.o. G2P0010 at [redacted]w[redacted]d being seen today for ongoing prenatal care.  She is currently monitored for the following issues for this low-risk pregnancy and has SVT (supraventricular tachycardia) (HCC) and Supervision of other normal pregnancy, antepartum on their problem list.  Patient reports {sx:14538}.   .  .   . Denies leaking of fluid.   The following portions of the patient's history were reviewed and updated as appropriate: allergies, current medications, past family history, past medical history, past social history, past surgical history and problem list.   Objective:  There were no vitals filed for this visit.  Fetal Status:           General:  Alert, oriented and cooperative. Patient is in no acute distress.  Skin: Skin is warm and dry. No rash noted.   Cardiovascular: Normal heart rate noted  Respiratory: Normal respiratory effort, no problems with respiration noted  Abdomen: Soft, gravid, appropriate for gestational age.        Pelvic: Cervical exam deferred        Extremities: Normal range of motion.     Mental Status: Normal mood and affect. Normal behavior. Normal judgment and thought content.   Assessment and Plan:  Pregnancy: G2P0010 at [redacted]w[redacted]d 1. Supervision of other normal pregnancy, antepartum - Reviewed MOF: *** - Schedule for anatomy US - ***  Preterm labor symptoms and general obstetric precautions including but not limited to vaginal bleeding, contractions, leaking of fluid and fetal movement were reviewed in detail with the patient. Please refer to After Visit Summary for other counseling recommendations.   No follow-ups on file.  Future Appointments  Date Time Provider Department Center  05/14/2022  4:10 PM Milas Hock, MD [facility]    Milas Hock, MD

## 2022-05-14 ENCOUNTER — Ambulatory Visit (INDEPENDENT_AMBULATORY_CARE_PROVIDER_SITE_OTHER): Payer: BC Managed Care – PPO | Admitting: Obstetrics and Gynecology

## 2022-05-14 ENCOUNTER — Encounter: Payer: Self-pay | Admitting: Obstetrics and Gynecology

## 2022-05-14 DIAGNOSIS — Z3482 Encounter for supervision of other normal pregnancy, second trimester: Secondary | ICD-10-CM

## 2022-05-14 DIAGNOSIS — Z3A14 14 weeks gestation of pregnancy: Secondary | ICD-10-CM

## 2022-05-14 DIAGNOSIS — Z3480 Encounter for supervision of other normal pregnancy, unspecified trimester: Secondary | ICD-10-CM

## 2022-05-14 NOTE — Progress Notes (Signed)
He was in her first trimester.  Thanks, Dr. Para March

## 2022-05-15 ENCOUNTER — Telehealth (HOSPITAL_BASED_OUTPATIENT_CLINIC_OR_DEPARTMENT_OTHER): Payer: Self-pay | Admitting: *Deleted

## 2022-05-15 NOTE — Telephone Encounter (Signed)
-----   Message from Lindalou Hose sent at 05/14/2022  4:40 PM EDT ----- Regarding: FW: Transfer  ----- Message ----- From: Acquanetta Sit, NT Sent: 05/14/2022   4:38 PM EDT To: Lindalou Hose; Jerene Bears, MD; # Subject: Transfer                                       Good afternoon. This patient has a new job in [place] and needs to transfer care to [place] due to distance. Please call patient for best scheduling. She needs a ROB appointment 4 weeks from today (05/14/2022). Patient can see an MD or App per Dr. Para March. Patient is supposed to contact MFM for best scheduling for her 19 week anatomy U/S. Thank you for your time.

## 2022-05-15 NOTE — Telephone Encounter (Signed)
LMOVM that I received request that pt wanted to transfer care to our location. Advised to call back to set up an appt.

## 2022-05-28 IMAGING — US US OB TRANSVAGINAL
1 series · 15 of 28 positions shown · non-contrast
Comparison: None.

CLINICAL DATA: Vaginal bleeding.

EXAM:
OBSTETRIC <14 WK US AND TRANSVAGINAL OB US
TECHNIQUE: Both transabdominal and transvaginal ultrasound examinations were
performed for complete evaluation of the gestation as well as the
maternal uterus, adnexal regions, and pelvic cul-de-sac.
Transvaginal technique was performed to assess early pregnancy.

[Series 1: us ob transvaginal · 15 of 43 slices shown]
[im 1/43]
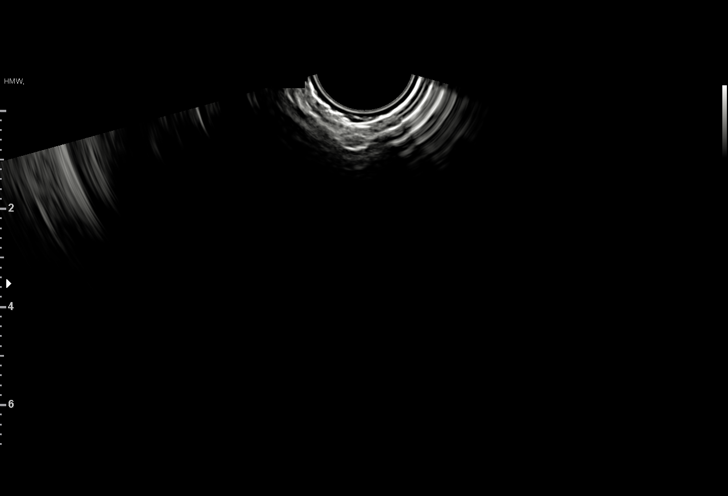
[im 4/43]
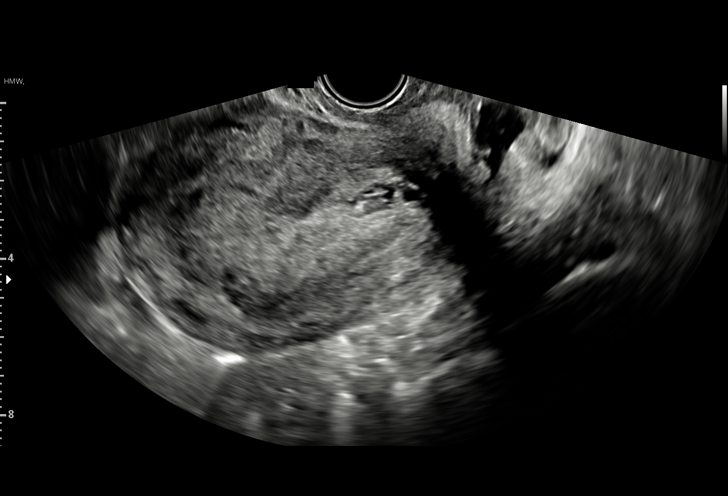
[im 7/43]
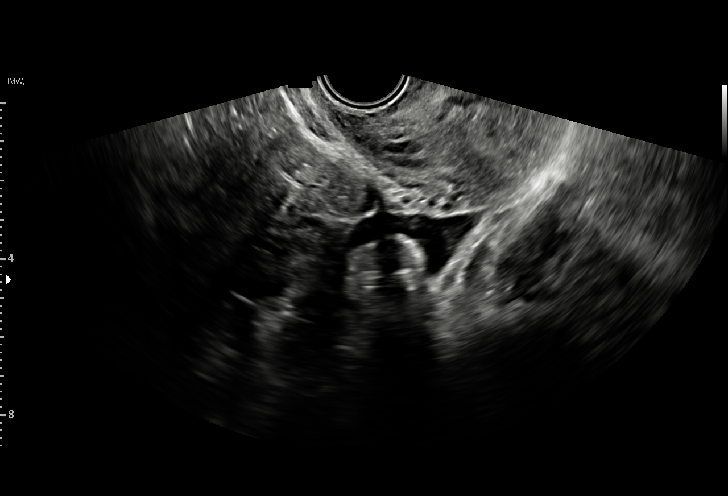
[im 10/43]
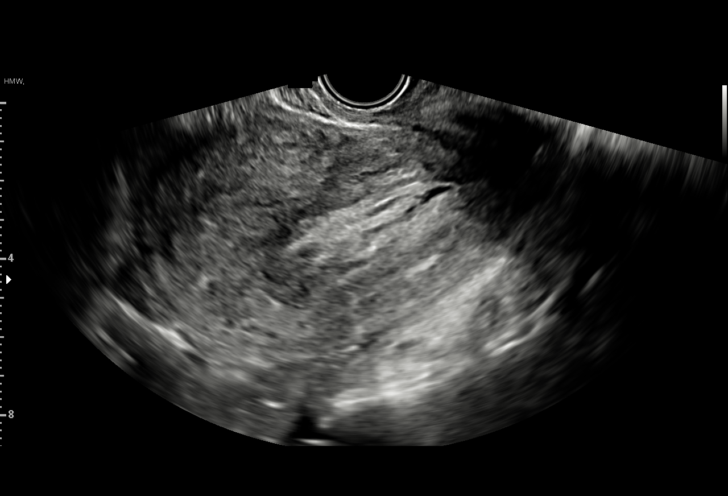
[im 13/43]
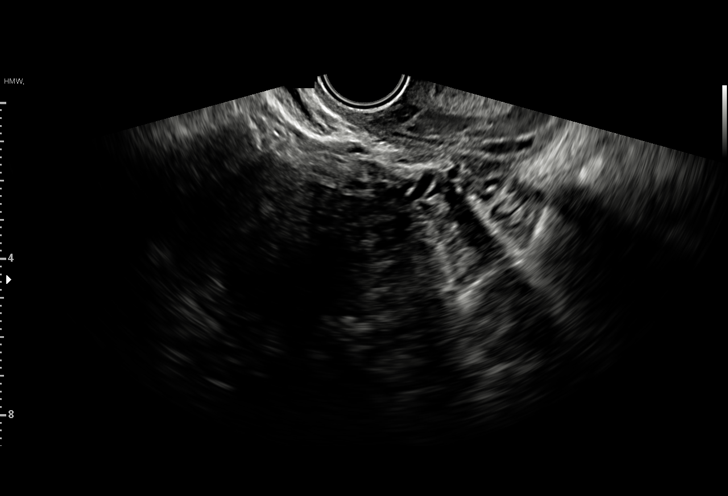
[im 16/43]
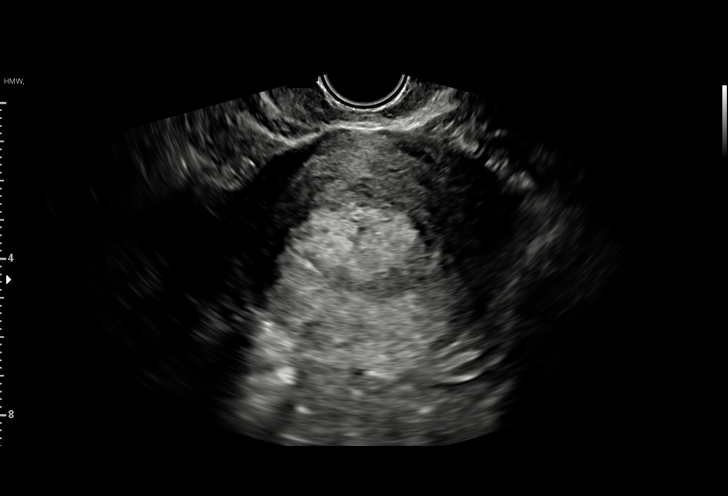
[im 19/43]
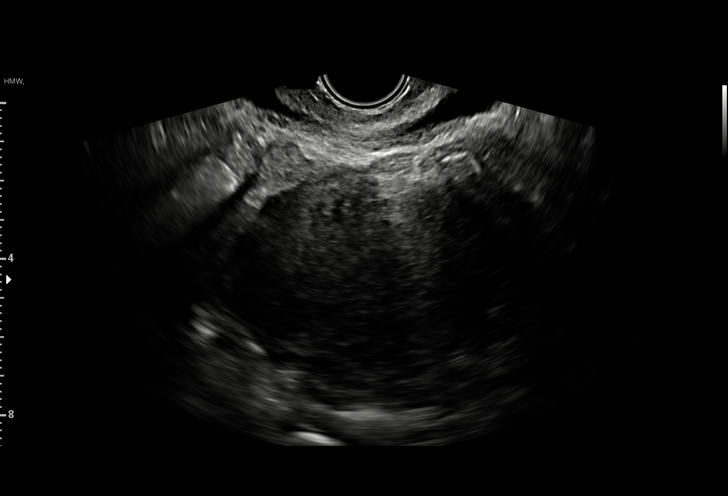
[im 22/43]
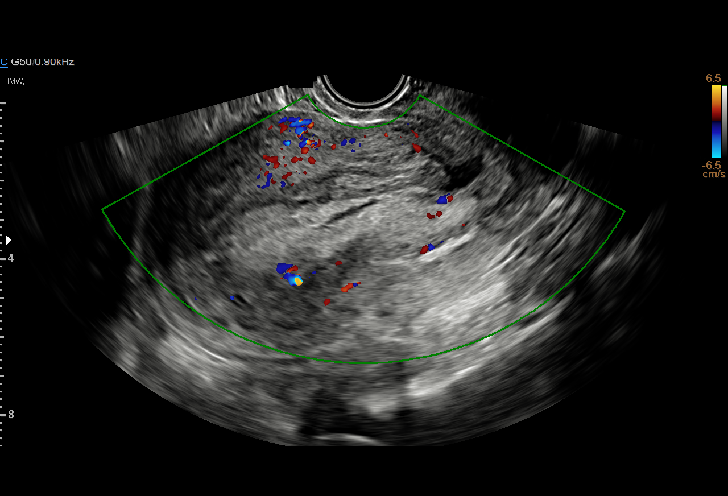
[im 24/43]
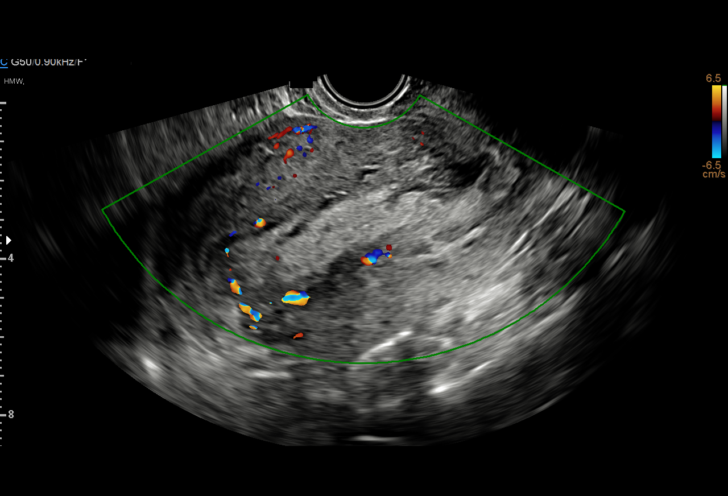
[im 27/43]
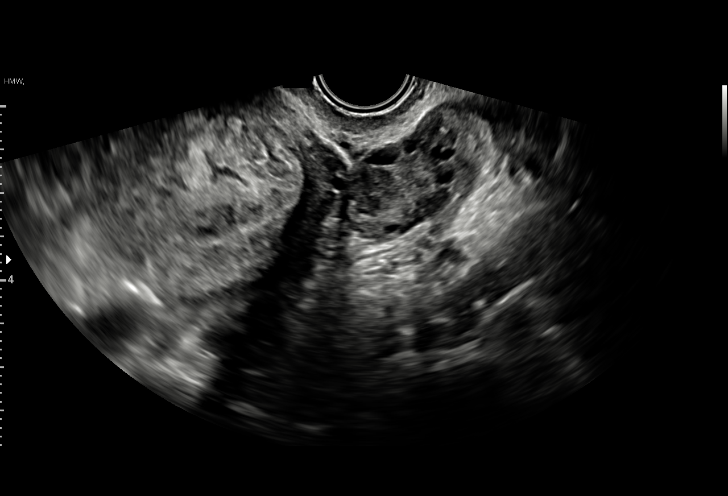
[im 30/43]
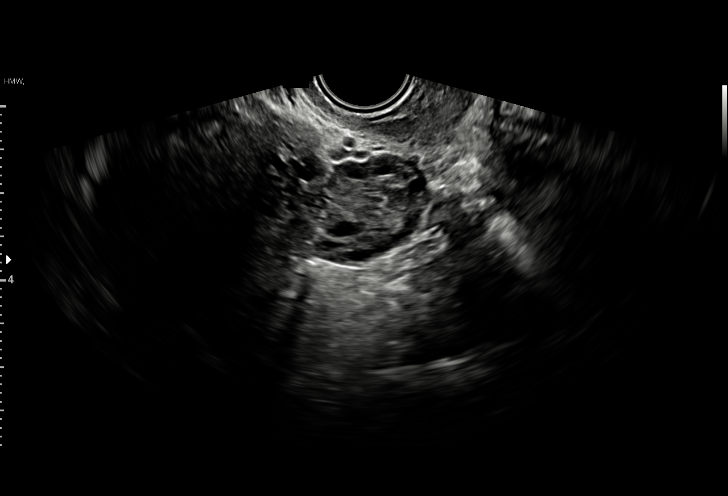
[im 33/43]
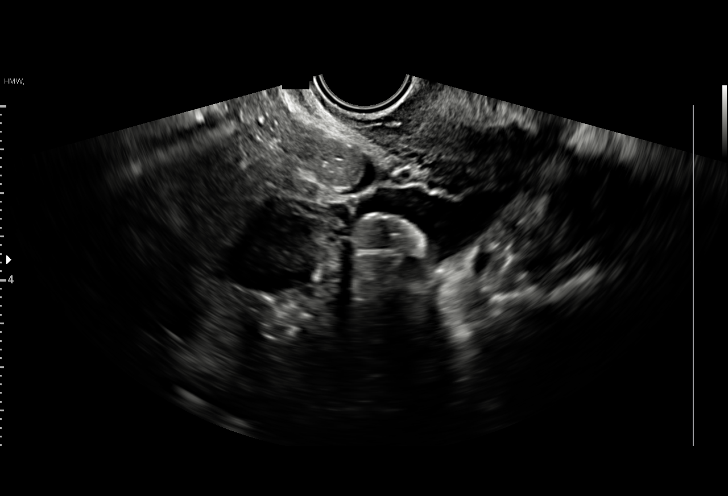
[im 36/43]
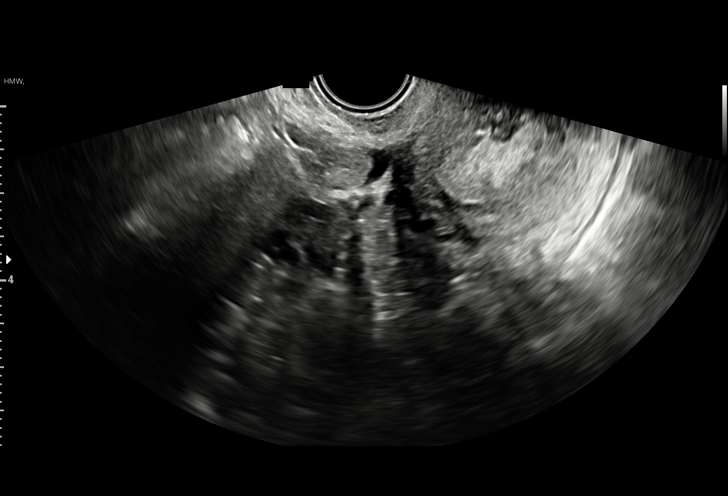
[im 39/43]
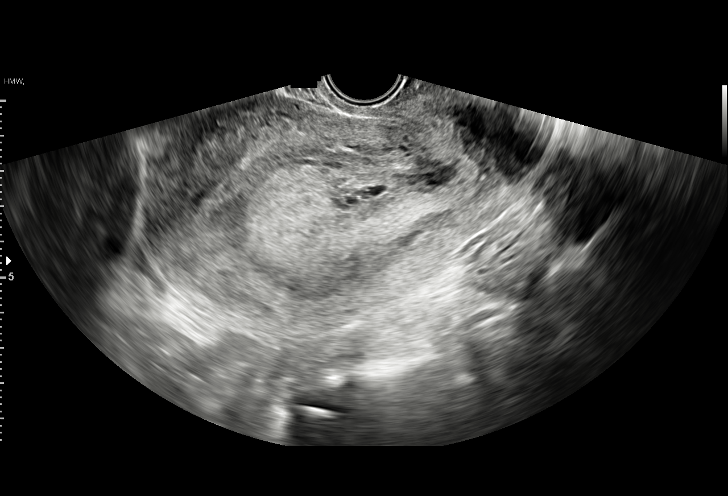
[im 43/43]
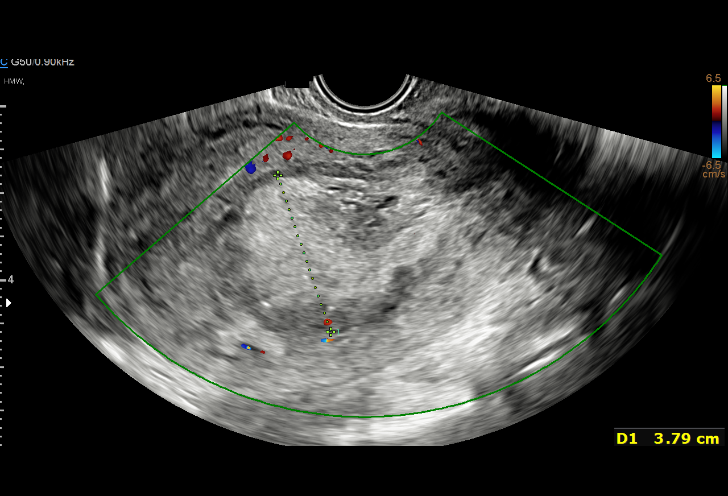

[15 of 28 positions shown; findings below may reference images not displayed]

FINDINGS: Intrauterine gestational sac: None

Yolk sac:  Not Visualized.

Embryo:  Not Visualized.

Cardiac Activity: Not Visualized.

Heart Rate: N/A  bpm

Maternal uterus/adnexae: The endometrium measures 3.8 cm in
thickness.

The right ovary measures 2.8 cm x 2.3 cm x 1.9 cm and is normal in
appearance.

The left ovary measures 3.0 cm x 2.1 cm x 2.5 cm and is normal in
appearance.

No pelvic free fluid is identified.
IMPRESSION: Thickened endometrium without evidence of an intrauterine pregnancy.
Correlation with follow-up pelvic ultrasound and serial beta HCG
levels is recommended.

## 2022-06-11 ENCOUNTER — Encounter (HOSPITAL_BASED_OUTPATIENT_CLINIC_OR_DEPARTMENT_OTHER): Payer: Self-pay | Admitting: Obstetrics & Gynecology

## 2022-06-11 ENCOUNTER — Ambulatory Visit (INDEPENDENT_AMBULATORY_CARE_PROVIDER_SITE_OTHER): Payer: Commercial Managed Care - PPO | Admitting: Obstetrics & Gynecology

## 2022-06-11 VITALS — BP 129/69 | HR 93 | Wt 146.6 lb

## 2022-06-11 DIAGNOSIS — Z3480 Encounter for supervision of other normal pregnancy, unspecified trimester: Secondary | ICD-10-CM

## 2022-06-11 DIAGNOSIS — Z8679 Personal history of other diseases of the circulatory system: Secondary | ICD-10-CM

## 2022-06-11 DIAGNOSIS — Z3A18 18 weeks gestation of pregnancy: Secondary | ICD-10-CM

## 2022-06-11 NOTE — Progress Notes (Unsigned)
   PRENATAL VISIT NOTE  Subjective:  Shelby Carr is a 26 y.o. G2P0010 at [redacted]w[redacted]d being seen today for ongoing prenatal care.  She is currently monitored for the following issues for this {Blank single:19197::"high-risk","low-risk"} pregnancy and has SVT (supraventricular tachycardia) (HCC) and Supervision of other normal pregnancy, antepartum on their problem list.  Patient reports {sx:14538}.  Contractions: Not present. Vag. Bleeding: None.  Movement: Absent. Denies leaking of fluid.   The following portions of the patient's history were reviewed and updated as appropriate: allergies, current medications, past family history, past medical history, past social history, past surgical history and problem list.   Objective:   Vitals:   06/11/22 1131  BP: 129/69  Pulse: 93  Weight: 146 lb 9.6 oz (66.5 kg)    Fetal Status:     Movement: Absent     General:  Alert, oriented and cooperative. Patient is in no acute distress.  Skin: Skin is warm and dry. No rash noted.   Cardiovascular: Normal heart rate noted  Respiratory: Normal respiratory effort, no problems with respiration noted  Abdomen: Soft, gravid, appropriate for gestational age.  Pain/Pressure: Absent     Pelvic: {Blank single:19197::"Cervical exam performed in the presence of a chaperone","Cervical exam deferred"}        Extremities: Normal range of motion.  Edema: None  Mental Status: Normal mood and affect. Normal behavior. Normal judgment and thought content.   Assessment and Plan:  Pregnancy: G2P0010 at [redacted]w[redacted]d There are no diagnoses linked to this encounter. {Blank single:19197::"Term","Preterm"} labor symptoms and general obstetric precautions including but not limited to vaginal bleeding, contractions, leaking of fluid and fetal movement were reviewed in detail with the patient. Please refer to After Visit Summary for other counseling recommendations.   No follow-ups on file.  Future Appointments  Date Time Provider  Department Center  06/15/2022  7:45 AM [facility] US5 [facility]    Harrie Jeans, RN

## 2022-06-14 ENCOUNTER — Encounter (HOSPITAL_BASED_OUTPATIENT_CLINIC_OR_DEPARTMENT_OTHER): Payer: Self-pay | Admitting: Obstetrics & Gynecology

## 2022-06-15 ENCOUNTER — Other Ambulatory Visit: Payer: Self-pay | Admitting: *Deleted

## 2022-06-15 ENCOUNTER — Ambulatory Visit: Payer: Commercial Managed Care - PPO | Attending: Obstetrics and Gynecology

## 2022-06-15 DIAGNOSIS — Z3A19 19 weeks gestation of pregnancy: Secondary | ICD-10-CM | POA: Diagnosis not present

## 2022-06-15 DIAGNOSIS — Z362 Encounter for other antenatal screening follow-up: Secondary | ICD-10-CM

## 2022-06-15 DIAGNOSIS — Z3480 Encounter for supervision of other normal pregnancy, unspecified trimester: Secondary | ICD-10-CM | POA: Diagnosis not present

## 2022-06-15 DIAGNOSIS — Z363 Encounter for antenatal screening for malformations: Secondary | ICD-10-CM | POA: Diagnosis present

## 2022-06-15 DIAGNOSIS — O358XX0 Maternal care for other (suspected) fetal abnormality and damage, not applicable or unspecified: Secondary | ICD-10-CM | POA: Insufficient documentation

## 2022-06-19 ENCOUNTER — Encounter: Payer: Self-pay | Admitting: Obstetrics and Gynecology

## 2022-06-19 ENCOUNTER — Encounter: Payer: Self-pay | Admitting: *Deleted

## 2022-06-24 ENCOUNTER — Encounter: Payer: Self-pay | Admitting: *Deleted

## 2022-06-24 ENCOUNTER — Encounter: Payer: Self-pay | Admitting: Obstetrics and Gynecology

## 2022-06-24 DIAGNOSIS — Z3480 Encounter for supervision of other normal pregnancy, unspecified trimester: Secondary | ICD-10-CM

## 2022-07-07 ENCOUNTER — Ambulatory Visit (INDEPENDENT_AMBULATORY_CARE_PROVIDER_SITE_OTHER): Payer: Commercial Managed Care - PPO | Admitting: Advanced Practice Midwife

## 2022-07-07 VITALS — BP 118/70 | HR 82 | Wt 153.6 lb

## 2022-07-07 DIAGNOSIS — Z3480 Encounter for supervision of other normal pregnancy, unspecified trimester: Secondary | ICD-10-CM

## 2022-07-07 DIAGNOSIS — Z3A22 22 weeks gestation of pregnancy: Secondary | ICD-10-CM

## 2022-07-07 DIAGNOSIS — Z8679 Personal history of other diseases of the circulatory system: Secondary | ICD-10-CM

## 2022-07-07 NOTE — Progress Notes (Unsigned)
   PRENATAL VISIT NOTE  Subjective:  Shelby Carr is a 26 y.o. G2P0010 at [redacted]w[redacted]d being seen today for ongoing prenatal care.  She is currently monitored for the following issues for this {Blank single:19197::"high-risk","low-risk"} pregnancy and has History of supraventricular tachycardia and Supervision of other normal pregnancy, antepartum on their problem list.  Patient reports {sx:14538}.   .  .   . Denies leaking of fluid.   The following portions of the patient's history were reviewed and updated as appropriate: allergies, current medications, past family history, past medical history, past social history, past surgical history and problem list.   Objective:  There were no vitals filed for this visit.  Fetal Status:           General:  Alert, oriented and cooperative. Patient is in no acute distress.  Skin: Skin is warm and dry. No rash noted.   Cardiovascular: Normal heart rate noted  Respiratory: Normal respiratory effort, no problems with respiration noted  Abdomen: Soft, gravid, appropriate for gestational age.        Pelvic: {Blank single:19197::"Cervical exam performed in the presence of a chaperone","Cervical exam deferred"}        Extremities: Normal range of motion.     Mental Status: Normal mood and affect. Normal behavior. Normal judgment and thought content.   Assessment and Plan:  Pregnancy: G2P0010 at [redacted]w[redacted]d 1. Supervision of other normal pregnancy, antepartum ***  2. History of supraventricular tachycardia ***  3. [redacted] weeks gestation of pregnancy ***  {Blank single:19197::"Term","Preterm"} labor symptoms and general obstetric precautions including but not limited to vaginal bleeding, contractions, leaking of fluid and fetal movement were reviewed in detail with the patient. Please refer to After Visit Summary for other counseling recommendations.   No follow-ups on file.  Future Appointments  Date Time Provider [place]  07/07/2022  3:55 PM  Elvera Maria CNM [facility]  07/13/2022  7:45 AM [facility] NURSE [facility]  07/13/2022  8:00 AM [facility] US1 [facility]  08/10/2022  8:30 AM Megan Salon, MD [facility]  09/01/2022  3:55 PM Leftwich-Kirby, Kathie Dike, CNM [facility]  10/15/2022  4:15 PM Megan Salon, MD [facility]  10/22/2022  4:00 PM Megan Salon, MD [facility]  10/29/2022  3:55 PM Megan Salon, MD [facility]  11/05/2022  3:55 PM Megan Salon, MD [facility]  11/12/2022  3:55 PM Megan Salon, MD [facility]    Fatima Blank, CNM

## 2022-07-13 ENCOUNTER — Ambulatory Visit: Payer: Commercial Managed Care - PPO | Admitting: *Deleted

## 2022-07-13 ENCOUNTER — Ambulatory Visit: Payer: Commercial Managed Care - PPO | Attending: Obstetrics

## 2022-07-13 DIAGNOSIS — Z3A23 23 weeks gestation of pregnancy: Secondary | ICD-10-CM | POA: Diagnosis not present

## 2022-07-13 DIAGNOSIS — O358XX0 Maternal care for other (suspected) fetal abnormality and damage, not applicable or unspecified: Secondary | ICD-10-CM | POA: Diagnosis not present

## 2022-07-13 DIAGNOSIS — Z362 Encounter for other antenatal screening follow-up: Secondary | ICD-10-CM | POA: Insufficient documentation

## 2022-08-10 ENCOUNTER — Ambulatory Visit (INDEPENDENT_AMBULATORY_CARE_PROVIDER_SITE_OTHER): Payer: Commercial Managed Care - PPO | Admitting: Obstetrics & Gynecology

## 2022-08-10 ENCOUNTER — Encounter (HOSPITAL_BASED_OUTPATIENT_CLINIC_OR_DEPARTMENT_OTHER): Payer: Self-pay

## 2022-08-10 DIAGNOSIS — Z3482 Encounter for supervision of other normal pregnancy, second trimester: Secondary | ICD-10-CM

## 2022-08-12 ENCOUNTER — Ambulatory Visit (INDEPENDENT_AMBULATORY_CARE_PROVIDER_SITE_OTHER): Payer: Commercial Managed Care - PPO

## 2022-08-12 DIAGNOSIS — R300 Dysuria: Secondary | ICD-10-CM | POA: Diagnosis not present

## 2022-08-12 LAB — POCT URINALYSIS DIPSTICK OB
Bilirubin, UA: NEGATIVE
Glucose, UA: NEGATIVE
Ketones, UA: NEGATIVE
Nitrite, UA: NEGATIVE
POC,PROTEIN,UA: NEGATIVE
Spec Grav, UA: 1.025 (ref 1.010–1.025)
Urobilinogen, UA: 0.2 E.U./dL
pH, UA: 5.5 (ref 5.0–8.0)

## 2022-08-12 MED ORDER — NITROFURANTOIN MONOHYD MACRO 100 MG PO CAPS: 100.0000 mg | ORAL_CAPSULE | Freq: Two times a day (BID) | ORAL | 0 refills | Status: DC

## 2022-08-12 NOTE — Progress Notes (Signed)
Patient came in today with complaints of feeling like she has a UTI. Patient is experiencing some discomfort when urinating. Sample was collected, evaluated and sent for culture. Per protocol, Macrobid 100mg  BID X7 days was sent in for patient. Patient states she has no known drug allergies. tbw

## 2022-08-12 NOTE — Progress Notes (Signed)
Appt cancelled and rescheduled for 1 week.

## 2022-08-15 LAB — URINE CULTURE

## 2022-08-17 ENCOUNTER — Ambulatory Visit (INDEPENDENT_AMBULATORY_CARE_PROVIDER_SITE_OTHER): Payer: Commercial Managed Care - PPO | Admitting: Obstetrics & Gynecology

## 2022-08-17 ENCOUNTER — Encounter (HOSPITAL_BASED_OUTPATIENT_CLINIC_OR_DEPARTMENT_OTHER): Payer: Self-pay | Admitting: Obstetrics & Gynecology

## 2022-08-17 VITALS — BP 133/79 | HR 115 | Wt 162.4 lb

## 2022-08-17 DIAGNOSIS — Z23 Encounter for immunization: Secondary | ICD-10-CM | POA: Diagnosis not present

## 2022-08-17 DIAGNOSIS — Z3483 Encounter for supervision of other normal pregnancy, third trimester: Secondary | ICD-10-CM

## 2022-08-17 DIAGNOSIS — Z8679 Personal history of other diseases of the circulatory system: Secondary | ICD-10-CM

## 2022-08-17 DIAGNOSIS — Z3A28 28 weeks gestation of pregnancy: Secondary | ICD-10-CM

## 2022-08-17 DIAGNOSIS — Z3480 Encounter for supervision of other normal pregnancy, unspecified trimester: Secondary | ICD-10-CM

## 2022-08-17 DIAGNOSIS — M549 Dorsalgia, unspecified: Secondary | ICD-10-CM

## 2022-08-17 DIAGNOSIS — N3000 Acute cystitis without hematuria: Secondary | ICD-10-CM

## 2022-08-17 NOTE — Progress Notes (Deleted)
   PRENATAL VISIT NOTE  Subjective:  Shelby Carr is a 26 y.o. G2P0010 at [redacted]w[redacted]d being seen today for ongoing prenatal care.  She is currently monitored for the following issues for this low-risk pregnancy and has History of supraventricular tachycardia and Supervision of other normal pregnancy, antepartum on their problem list.  Patient reports {sx:14538}.   .  .   . Denies leaking of fluid.   The following portions of the patient's history were reviewed and updated as appropriate: allergies, current medications, past family history, past medical history, past social history, past surgical history and problem list.   Objective:  There were no vitals filed for this visit.  Fetal Status:           General:  Alert, oriented and cooperative. Patient is in no acute distress.  Skin: Skin is warm and dry. No rash noted.   Cardiovascular: Normal heart rate noted  Respiratory: Normal respiratory effort, no problems with respiration noted  Abdomen: Soft, gravid, appropriate for gestational age.        Pelvic: {Blank single:19197::"Cervical exam performed in the presence of a chaperone","Cervical exam deferred"}        Extremities: Normal range of motion.     Mental Status: Normal mood and affect. Normal behavior. Normal judgment and thought content.   Assessment and Plan:  Pregnancy: G2P0010 at [redacted]w[redacted]d 1. Prenatal care, subsequent pregnancy, second trimester *** - CBC - Glucose Tolerance, 2 Hours w/1 Hour - HIV Antibody (routine testing w rflx) - RPR  {Blank single:19197::"Term","Preterm"} labor symptoms and general obstetric precautions including but not limited to vaginal bleeding, contractions, leaking of fluid and fetal movement were reviewed in detail with the patient. Please refer to After Visit Summary for other counseling recommendations.   No follow-ups on file.  Future Appointments  Date Time Provider Department Center  09/01/2022  3:55 PM Hurshel Party, CNM  [facility]  09/15/2022  3:55 PM Leftwich-Kirby, Wilmer Floor, CNM [facility]  10/07/2022  3:55 PM Jerene Bears, MD [facility]  10/15/2022  4:15 PM Jerene Bears, MD [facility]  10/22/2022  3:55 PM Jerene Bears, MD [facility]  10/29/2022  3:55 PM Jerene Bears, MD [facility]  11/05/2022  3:55 PM Jerene Bears, MD [facility]  11/12/2022  3:55 PM Jerene Bears, MD [facility]    Harrie Jeans, RN

## 2022-08-17 NOTE — Progress Notes (Signed)
   PRENATAL VISIT NOTE  Subjective:  Shelby Carr is a 26 y.o. G2P0010 at [redacted]w[redacted]d being seen today for ongoing prenatal care.  She is currently monitored for the following issues for this low-risk pregnancy and has History of supraventricular tachycardia and Supervision of other normal pregnancy, antepartum on their problem list.  Patient reports some mid right back pain.  Has never seen a chiropractor.  If she walks or cleans, she starts having a lot more lower back tightness/pain.  Contractions: Not present. Vag. Bleeding: None.  Movement: Present. Denies leaking of fluid.   The following portions of the patient's history were reviewed and updated as appropriate: allergies, current medications, past family history, past medical history, past social history, past surgical history and problem list.   Objective:   Vitals:   08/17/22 1000  BP: 133/79  Pulse: (!) 115  Weight: 162 lb 6.4 oz (73.7 kg)    Fetal Status: Fetal Heart Rate (bpm): 156 Fundal Height: 28 cm Movement: Present     General:  Alert, oriented and cooperative. Patient is in no acute distress.  Skin: Skin is warm and dry. No rash noted.   Cardiovascular: Normal heart rate noted  Respiratory: Normal respiratory effort, no problems with respiration noted  Abdomen: Soft, gravid, appropriate for gestational age.  Pain/Pressure: Absent     Pelvic: Cervical exam deferred        Extremities: Normal range of motion.  Edema: None  Mental Status: Normal mood and affect. Normal behavior. Normal judgment and thought content.   Assessment and Plan:  Pregnancy: G2P0010 at 100w0d 1. Prenatal care, subsequent pregnancy, third trimester - on PNV - CBC - Glucose Tolerance, 2 Hours w/1 Hour - HIV Antibody (routine testing w rflx) - RPR  2. [redacted] weeks gestation of pregnancy - follow up 4 weeks  3. History of supraventricular tachycardia  4.  UTI - still on abx.  Feels better.  Will repeat urine culture at next visit.   5.  Mid  right back pain, exacerbated by activity - referral to sports medicine placed today  Preterm labor symptoms and general obstetric precautions including but not limited to vaginal bleeding, contractions, leaking of fluid and fetal movement were reviewed in detail with the patient. Please refer to After Visit Summary for other counseling recommendations.   Return in about 4 weeks (around 09/14/2022).  Future Appointments  Date Time Provider Department Center  09/01/2022  3:55 PM Hurshel Party, CNM [facility]  09/15/2022  3:55 PM Leftwich-Kirby, Wilmer Floor, CNM [facility]  10/07/2022  9:15 AM Jerene Bears, MD [facility]  10/15/2022  4:15 PM Jerene Bears, MD [facility]  10/22/2022  3:55 PM Jerene Bears, MD [facility]  10/29/2022  3:55 PM Jerene Bears, MD [facility]  11/05/2022  3:55 PM Jerene Bears, MD [facility]  11/12/2022  3:55 PM Jerene Bears, MD [facility]    Jerene Bears, MD

## 2022-08-18 LAB — CBC
Hematocrit: 32.6 % — ABNORMAL LOW (ref 34.0–46.6)
Hemoglobin: 10.2 g/dL — ABNORMAL LOW (ref 11.1–15.9)
MCH: 24.7 pg — ABNORMAL LOW (ref 26.6–33.0)
MCHC: 31.3 g/dL — ABNORMAL LOW (ref 31.5–35.7)
MCV: 79 fL (ref 79–97)
Platelets: 219 10*3/uL (ref 150–450)
RBC: 4.13 x10E6/uL (ref 3.77–5.28)
RDW: 13.1 % (ref 11.7–15.4)
WBC: 7.3 10*3/uL (ref 3.4–10.8)

## 2022-08-18 LAB — GLUCOSE TOLERANCE, 2 HOURS W/ 1HR
Glucose, 1 hour: 98 mg/dL (ref 70–179)
Glucose, 2 hour: 101 mg/dL (ref 70–152)
Glucose, Fasting: 81 mg/dL (ref 70–91)

## 2022-08-18 LAB — HIV ANTIBODY (ROUTINE TESTING W REFLEX): HIV Screen 4th Generation wRfx: NONREACTIVE

## 2022-08-18 LAB — RPR: RPR Ser Ql: NONREACTIVE

## 2022-08-21 ENCOUNTER — Encounter (HOSPITAL_BASED_OUTPATIENT_CLINIC_OR_DEPARTMENT_OTHER): Payer: Self-pay | Admitting: Obstetrics & Gynecology

## 2022-08-21 DIAGNOSIS — O2342 Unspecified infection of urinary tract in pregnancy, second trimester: Secondary | ICD-10-CM | POA: Insufficient documentation

## 2022-08-24 ENCOUNTER — Encounter (HOSPITAL_BASED_OUTPATIENT_CLINIC_OR_DEPARTMENT_OTHER): Payer: Self-pay | Admitting: Obstetrics & Gynecology

## 2022-08-24 NOTE — Progress Notes (Unsigned)
[facility] [address] [place] [address] Phone: [phone] Subjective:   Bruce Donath, am serving as a scribe for Dr. Antoine Primas.  I'm seeing this patient by the request  of:  Agapito Games, MD  CC: Low back pain during pregnancy  EPP:[id number]  Shelby Carr is a 26 y.o. female coming in with complaint of back pain. Patient states that her pain is in ribs in upper right side of her thoracic spine for past month. As the day goes on her pain increases. Lays down for pain relief. No history of issues with her back. Also has been having a locking in pelvis with walking and housecleaning.  Does not remember any true injury.  Only complication during pregnancy so far has been iron deficiency which she has started supplementation     Past Medical History:  Diagnosis Date   Anemia    History of supraventricular tachycardia    in high school   Past Surgical History:  Procedure Laterality Date   TONSILLECTOMY AND ADENOIDECTOMY  10/05/1998   WISDOM TOOTH EXTRACTION Bilateral    Social History   Socioeconomic History   Marital status: Married    Spouse name: Not on file   Number of children: Not on file   Years of education: Not on file   Highest education level: Not on file  Occupational History   Occupation: Consulting civil engineer, college  Tobacco Use   Smoking status: Never   Smokeless tobacco: Never  Vaping Use   Vaping Use: Never used  Substance and Sexual Activity   Alcohol use: No   Drug use: No   Sexual activity: Yes    Birth control/protection: None  Other Topics Concern   Not on file  Social History Narrative   Not on file   Social Determinants of Health   Financial Resource Strain: Not on file  Food Insecurity: Not on file  Transportation Needs: Not on file  Physical Activity: Not on file  Stress: Not on file  Social Connections: Not on file   No Known Allergies Family History  Problem Relation Age of Onset    Healthy Mother    Healthy Father    Alcohol abuse Maternal Grandmother    Hyperlipidemia Maternal Grandfather    Heart attack Paternal Grandfather    Hypertension Paternal Grandfather    Liver disease Paternal Grandfather          Current Outpatient Medications (Other):    nitrofurantoin, macrocrystal-monohydrate, (MACROBID) 100 MG capsule, Take 1 capsule (100 mg total) by mouth 2 (two) times daily.   Prenatal Vit-Fe Fumarate-FA (PRENATAL VITAMIN PO), Take by mouth.   Reviewed prior external information including notes and imaging from  primary care provider As well as notes that were available from care everywhere and other healthcare systems.  Past medical history, social, surgical and family history all reviewed in electronic medical record.  No pertanent information unless stated regarding to the chief complaint.   Review of Systems:  No headache, visual changes, nausea, vomiting, diarrhea, constipation, dizziness, abdominal pain, skin rash, fevers, chills, night sweats, weight loss, swollen lymph nodes,  joint swelling, chest pain, shortness of breath, mood changes. POSITIVE muscle aches, body aches  Objective  Blood pressure 114/80, pulse (!) 121, height 5\' 5"  (1.651 m), weight 163 lb (73.9 kg), last menstrual period 02/02/2022, SpO2 98 %, unknown if currently breastfeeding.   General: No apparent distress alert and oriented x3 mood and affect normal, dressed appropriately.  HEENT: Pupils equal, extraocular movements intact  Respiratory: Patient's speak in full sentences and does not appear short of breath  Cardiovascular: No lower extremity edema, non tender, no erythema  Patient is gravid.  Does have some tightness of the right hip flexor noted.  Seems to be longer in the L1 to the tendon area.  No masses appreciated.  Some very mild scapular dyskinesis noted right greater than left.  Negative straight leg test.  Osteopathic findings  T11 extended rotated and side bent  right L1 flexed rotated and side bent right Sacrum right on right     Impression and Recommendations:    The above documentation has been reviewed and is accurate and complete Judi Saa, DO

## 2022-08-26 ENCOUNTER — Encounter: Payer: Self-pay | Admitting: Family Medicine

## 2022-08-26 ENCOUNTER — Ambulatory Visit: Payer: Commercial Managed Care - PPO | Admitting: Family Medicine

## 2022-08-26 VITALS — BP 114/80 | HR 121 | Ht 65.0 in | Wt 163.0 lb

## 2022-08-26 DIAGNOSIS — M9903 Segmental and somatic dysfunction of lumbar region: Secondary | ICD-10-CM | POA: Diagnosis not present

## 2022-08-26 DIAGNOSIS — M9904 Segmental and somatic dysfunction of sacral region: Secondary | ICD-10-CM | POA: Diagnosis not present

## 2022-08-26 DIAGNOSIS — M24551 Contracture, right hip: Secondary | ICD-10-CM

## 2022-08-26 DIAGNOSIS — M9902 Segmental and somatic dysfunction of thoracic region: Secondary | ICD-10-CM

## 2022-08-26 NOTE — Assessment & Plan Note (Signed)
   Decision today to treat with OMT was based on Physical Exam  After verbal consent patient was treated with HVLA, ME, FPR techniques , thoracic, lumbar and sacral areas, all areas are chronic   Patient tolerated the procedure well with improvement in symptoms  Patient given exercises, stretches and lifestyle modifications  See medications in patient instructions if given  Patient will follow up in 4-8 weeks

## 2022-08-26 NOTE — Patient Instructions (Addendum)
Exercises Ice if having pain Stay active See me in 4-6 weeks

## 2022-08-26 NOTE — Assessment & Plan Note (Signed)
Patient does have tenderness of the right hip flexor noted.  Discussed with patient icing regimen and home exercises, which activities to do and which ones to avoid.  Patient is pregnant so is unable to do any significant over-the-counter medications.  Follow-up with me again in 4-6 weeks.  We discussed how the progesterone and during pregnancy could potentially cause some laxity.  Patient's baby is still sitting relatively high and not dropped of the pelvis yet which I think is also contributing to the higher lumbar pain at the moment.

## 2022-09-01 ENCOUNTER — Ambulatory Visit (INDEPENDENT_AMBULATORY_CARE_PROVIDER_SITE_OTHER): Payer: Commercial Managed Care - PPO | Admitting: Advanced Practice Midwife

## 2022-09-01 VITALS — BP 121/81 | HR 99 | Wt 168.4 lb

## 2022-09-01 DIAGNOSIS — Z3483 Encounter for supervision of other normal pregnancy, third trimester: Secondary | ICD-10-CM

## 2022-09-01 DIAGNOSIS — Z87440 Personal history of urinary (tract) infections: Secondary | ICD-10-CM

## 2022-09-01 NOTE — Progress Notes (Signed)
   PRENATAL VISIT NOTE  Subjective:  Shelby Carr is a 26 y.o. G2P0010 at [redacted]w[redacted]d being seen today for ongoing prenatal care.  She is currently monitored for the following issues for this low-risk pregnancy and has History of supraventricular tachycardia; Supervision of other normal pregnancy, antepartum; UTI (urinary tract infection) during pregnancy, second trimester; Right hip flexor tightness; and Somatic dysfunction of spine, lumbar on their problem list.  Patient reports no complaints.  Contractions: Not present. Vag. Bleeding: None.  Movement: Present. Denies leaking of fluid.   The following portions of the patient's history were reviewed and updated as appropriate: allergies, current medications, past family history, past medical history, past social history, past surgical history and problem list.   Objective:   Vitals:   09/01/22 1601  BP: 121/81  Pulse: 99  Weight: 168 lb 6.4 oz (76.4 kg)    Fetal Status: Fetal Heart Rate (bpm): 136 Fundal Height: 30 cm Movement: Present     General:  Alert, oriented and cooperative. Patient is in no acute distress.  Skin: Skin is warm and dry. No rash noted.   Cardiovascular: Normal heart rate noted  Respiratory: Normal respiratory effort, no problems with respiration noted  Abdomen: Soft, gravid, appropriate for gestational age.  Pain/Pressure: Absent     Pelvic: Cervical exam deferred        Extremities: Normal range of motion.  Edema: None  Mental Status: Normal mood and affect. Normal behavior. Normal judgment and thought content.   Assessment and Plan:  Pregnancy: G2P0010 at [redacted]w[redacted]d 1. Encounter for supervision of other normal pregnancy in third trimester --Anticipatory guidance about next visits/weeks of pregnancy given.   - Urine Culture  2. History of UTI --TOC today, no s/sx - Urine Culture  Preterm labor symptoms and general obstetric precautions including but not limited to vaginal bleeding, contractions, leaking of  fluid and fetal movement were reviewed in detail with the patient. Please refer to After Visit Summary for other counseling recommendations.   Return in about 2 weeks (around 09/15/2022) for As scheduled.  Future Appointments  Date Time Provider Department Center  09/15/2022  3:55 PM Hurshel Party, CNM [facility]  10/06/2022  1:30 PM Judi Saa, DO [facility] None  10/07/2022  9:15 AM Jerene Bears, MD [facility]  10/15/2022  4:15 PM Jerene Bears, MD [facility]  10/22/2022  3:55 PM Jerene Bears, MD [facility]  10/29/2022  3:55 PM Jerene Bears, MD [facility]  11/05/2022  3:55 PM Jerene Bears, MD [facility]  11/12/2022  3:55 PM Jerene Bears, MD [facility]    Sharen Counter, CNM

## 2022-09-02 LAB — URINE CULTURE

## 2022-09-14 ENCOUNTER — Encounter (HOSPITAL_BASED_OUTPATIENT_CLINIC_OR_DEPARTMENT_OTHER): Payer: Self-pay | Admitting: Obstetrics & Gynecology

## 2022-09-15 ENCOUNTER — Telehealth (INDEPENDENT_AMBULATORY_CARE_PROVIDER_SITE_OTHER): Payer: Commercial Managed Care - PPO | Admitting: Advanced Practice Midwife

## 2022-09-15 ENCOUNTER — Encounter (HOSPITAL_BASED_OUTPATIENT_CLINIC_OR_DEPARTMENT_OTHER): Payer: Self-pay | Admitting: Advanced Practice Midwife

## 2022-09-15 VITALS — BP 113/71 | HR 97 | Wt 164.0 lb

## 2022-09-15 DIAGNOSIS — O98513 Other viral diseases complicating pregnancy, third trimester: Secondary | ICD-10-CM

## 2022-09-15 DIAGNOSIS — Z3483 Encounter for supervision of other normal pregnancy, third trimester: Secondary | ICD-10-CM

## 2022-09-15 DIAGNOSIS — Z3A32 32 weeks gestation of pregnancy: Secondary | ICD-10-CM

## 2022-09-15 DIAGNOSIS — U071 COVID-19: Secondary | ICD-10-CM | POA: Insufficient documentation

## 2022-09-15 NOTE — Progress Notes (Signed)
OBSTETRICS PRENATAL VIRTUAL VISIT ENCOUNTER NOTE  Provider location: [facility]   Patient location: Home  I connected with Shelby Carr on 09/15/22 at  3:55 PM EST by MyChart Video Encounter and verified that I am speaking with the correct person using two identifiers. I discussed the limitations, risks, security and privacy concerns of performing an evaluation and management service virtually and the availability of in person appointments. I also discussed with the patient that there may be a patient responsible charge related to this service. The patient expressed understanding and agreed to proceed. Subjective:  Shelby Carr is a 26 y.o. G2P0010 at [redacted]w[redacted]d being seen today for ongoing prenatal care.  She is currently monitored for the following issues for this low-risk pregnancy and has History of supraventricular tachycardia; Supervision of other normal pregnancy, antepartum; UTI (urinary tract infection) during pregnancy, second trimester; Right hip flexor tightness; and Somatic dysfunction of spine, lumbar on their problem list.  Patient reports  cough, URI symptoms, denies SOB .  Contractions: Not present. Vag. Bleeding: None.  Movement: Present. Denies any leaking of fluid.   The following portions of the patient's history were reviewed and updated as appropriate: allergies, current medications, past family history, past medical history, past social history, past surgical history and problem list.   Objective:   Vitals:   09/15/22 1702  BP: 113/71  Pulse: 97  Weight: 164 lb (74.4 kg)    Fetal Status:     Movement: Present     General:  Alert, oriented and cooperative. Patient is in no acute distress.  Respiratory: Normal respiratory effort, no problems with respiration noted  Mental Status: Normal mood and affect. Normal behavior. Normal judgment and thought content.  Rest of physical exam deferred due to type of encounter  Imaging: No  results found.  Assessment and Plan:  Pregnancy: G2P0010 at [redacted]w[redacted]d 1. [redacted] weeks gestation of pregnancy   2. Encounter for supervision of other normal pregnancy in third trimester --Pt reports good fetal movement, denies cramping, LOF, or vaginal bleeding  --Anticipatory guidance about next visits/weeks of pregnancy given.   3. COVID-19 affecting pregnancy in third trimester --Discussed symptom management, reviewed s/sx/reasons to seek emergency care --List of safe OTC medications sent   Preterm labor symptoms and general obstetric precautions including but not limited to vaginal bleeding, contractions, leaking of fluid and fetal movement were reviewed in detail with the patient. I discussed the assessment and treatment plan with the patient. The patient was provided an opportunity to ask questions and all were answered. The patient agreed with the plan and demonstrated an understanding of the instructions. The patient was advised to call back or seek an in-person office evaluation/go to [facility] at [facility] for any urgent or concerning symptoms. Please refer to After Visit Summary for other counseling recommendations.   I provided 10 minutes of face-to-face time during this encounter.  No follow-ups on file.  Future Appointments  Date Time Provider Department Center  10/06/2022  1:30 PM Judi Saa, DO [facility] None  10/07/2022  9:15 AM Jerene Bears, MD [facility]  10/15/2022  4:15 PM Jerene Bears, MD [facility]  10/22/2022  3:55 PM Jerene Bears, MD [facility]  10/29/2022  3:55 PM Jerene Bears, MD [facility]  11/05/2022  3:55 PM Jerene Bears, MD [facility]  11/12/2022  3:55 PM Jerene Bears, MD [facility]    Sharen Counter, CNM [facility] [place]

## 2022-09-30 ENCOUNTER — Encounter (HOSPITAL_BASED_OUTPATIENT_CLINIC_OR_DEPARTMENT_OTHER): Payer: Commercial Managed Care - PPO | Admitting: Obstetrics & Gynecology

## 2022-09-30 NOTE — Progress Notes (Signed)
  [facility] [address] [place] [address] Phone: [phone] Subjective:   Bruce Donath, am serving as a scribe for Dr. Antoine Primas.  I'm seeing this patient by the request  of:  Agapito Games, MD  CC: Low back pain  WPY:[id number]  Shelby Carr is a 26 y.o. female coming in with complaint of back and neck pain. OMT 08/26/2022. Patient states that she is doing better. Does have some pain in R side of ribs. Has been stretching.   Medications patient has been prescribed:   Taking:         Reviewed prior external information including notes and imaging from previsou exam, outside providers and external EMR if available.   As well as notes that were available from care everywhere and other healthcare systems.  Past medical history, social, surgical and family history all reviewed in electronic medical record.  No pertanent information unless stated regarding to the chief complaint.   Past Medical History:  Diagnosis Date   Anemia    History of supraventricular tachycardia    in high school    No Known Allergies   Review of Systems:  No headache, visual changes, nausea, vomiting, diarrhea, constipation, dizziness, abdominal pain, skin rash, fevers, chills, night sweats, weight loss, swollen lymph nodes, body aches, joint swelling, chest pain, shortness of breath, mood changes. POSITIVE muscle aches  Objective  Blood pressure 122/70, pulse (!) 122, height 5\' 5"  (1.651 m), weight 170 lb (77.1 kg), last menstrual period 02/02/2022, SpO2 99 %, unknown if currently breastfeeding.   General: No apparent distress alert and oriented x3 mood and affect normal, dressed appropriately.  HEENT: Pupils equal, extraocular movements intact  Respiratory: Patient's speak in full sentences and does not appear short of breath  Cardiovascular: No lower extremity edema, non tender, no erythema  Patient is gravid.  Increase in lordosis of  the lumbar spine.  Tenderness to palpation in the paraspinal musculature.  Patient does have tightness with FABER test bilaterally right greater than left.  Osteopathic findings   T9 extended rotated and side bent left L2 flexed rotated and side bent right L5 flexed rotated and side bent left Sacrum right on right       Assessment and Plan:  Right hip flexor tightness Tight right hip flexor now giving her some mild left sacroiliac joint pain.  Discussed with patient about icing regimen and home exercises, discussed which activities to do and which ones to avoid.  Increase activity slowly over the course of next several weeks.  Discussed icing regimen and home exercises.  Follow-up again in 4 weeks.    Nonallopathic problems  Decision today to treat with OMT was based on Physical Exam  After verbal consent patient was treated with HVLA, ME, FPR techniques in  thoracic, lumbar, and sacral  areas  Patient tolerated the procedure well with improvement in symptoms  Patient given exercises, stretches and lifestyle modifications  See medications in patient instructions if given  Patient will follow up in 4-8 weeks     The above documentation has been reviewed and is accurate and complete 04/04/2022, DO         Note: This dictation was prepared with Dragon dictation along with smaller phrase technology. Any transcriptional errors that result from this process are unintentional.

## 2022-10-01 ENCOUNTER — Encounter (HOSPITAL_BASED_OUTPATIENT_CLINIC_OR_DEPARTMENT_OTHER): Payer: Commercial Managed Care - PPO | Admitting: Obstetrics & Gynecology

## 2022-10-05 NOTE — L&D Delivery Note (Signed)
[facility] Delivery Note  Shelby Carr is a 27 y.o. G2P0010 s/p vag del at [redacted]w[redacted]d She was admitted for IOL due to preg over 40wks.   ROM: 10h 551mith clear fluid GBS Status: neg Maximum Maternal Temperature: 100.0  Labor Progress: Shelby Carr admitted on 11/13/22; she had a cervical foley placed in the office on 2/8 which was still in place but dislodged shortly after arrival; Pitocin was started and then AROM a few hours later. Once complete, she pushed for 3h 1534mto vag del.  Delivery Date/Time: February 10th, 2024 at 0533 Delivery: [facility] Incto room and head was crowning, delivering shortly after- ROA. Nuchal cord present x 1, loose and reduced. Shoulder and body delivered in usual fashion. Infant with spontaneous cry, placed on mother's abdomen, dried and stimulated. Cord clamped x 2 after 1-minute delay, and cut by FOB. Cord blood drawn. Placenta delivered spontaneously with gentle cord traction. Fundus firm with massage and Pitocin. Labia, perineum, vagina, and cervix inspected and found to be intact.   Placenta: spont, intact; to L&D Complications: none Lacerations: none EBL: 233cc Analgesia: epidural  Postpartum Planning [x]$  message to sent to schedule follow-up   Infant: girl  APGARs 8/9  3230g (7lb 1.9oz)  KimMyrtis SerNM  11/14/2022 5:55 AM

## 2022-10-06 ENCOUNTER — Ambulatory Visit: Payer: Commercial Managed Care - PPO | Admitting: Family Medicine

## 2022-10-06 VITALS — BP 122/70 | HR 122 | Ht 65.0 in | Wt 170.0 lb

## 2022-10-06 DIAGNOSIS — M9902 Segmental and somatic dysfunction of thoracic region: Secondary | ICD-10-CM | POA: Diagnosis not present

## 2022-10-06 DIAGNOSIS — M24551 Contracture, right hip: Secondary | ICD-10-CM | POA: Diagnosis not present

## 2022-10-06 DIAGNOSIS — M9903 Segmental and somatic dysfunction of lumbar region: Secondary | ICD-10-CM

## 2022-10-06 DIAGNOSIS — M9904 Segmental and somatic dysfunction of sacral region: Secondary | ICD-10-CM

## 2022-10-06 NOTE — Assessment & Plan Note (Signed)
Tight right hip flexor now giving her some mild left sacroiliac joint pain.  Discussed with patient about icing regimen and home exercises, discussed which activities to do and which ones to avoid.  Increase activity slowly over the course of next several weeks.  Discussed icing regimen and home exercises.  Follow-up again in 4 weeks.

## 2022-10-07 ENCOUNTER — Ambulatory Visit (INDEPENDENT_AMBULATORY_CARE_PROVIDER_SITE_OTHER): Payer: Commercial Managed Care - PPO | Admitting: Obstetrics & Gynecology

## 2022-10-07 VITALS — BP 130/75 | HR 104 | Wt 172.8 lb

## 2022-10-07 DIAGNOSIS — O2342 Unspecified infection of urinary tract in pregnancy, second trimester: Secondary | ICD-10-CM

## 2022-10-07 DIAGNOSIS — Z3A35 35 weeks gestation of pregnancy: Secondary | ICD-10-CM

## 2022-10-07 DIAGNOSIS — Z3480 Encounter for supervision of other normal pregnancy, unspecified trimester: Secondary | ICD-10-CM

## 2022-10-07 DIAGNOSIS — Z8679 Personal history of other diseases of the circulatory system: Secondary | ICD-10-CM

## 2022-10-07 DIAGNOSIS — D508 Other iron deficiency anemias: Secondary | ICD-10-CM

## 2022-10-07 NOTE — Progress Notes (Signed)
   PRENATAL VISIT NOTE  Subjective:  Shelby Carr is a 27 y.o. G2P0010 at [redacted]w[redacted]d being seen today for ongoing prenatal care.  She is currently monitored for the following issues for this low-risk pregnancy and has History of supraventricular tachycardia; Supervision of other normal pregnancy, antepartum; UTI (urinary tract infection) during pregnancy, second trimester; Right hip flexor tightness; Somatic dysfunction of spine, lumbar; and COVID-19 affecting pregnancy in third trimester on their problem list.  Patient reports no complaints.  Contractions: Not present. Vag. Bleeding: None.  Movement: Present. Denies leaking of fluid.   The following portions of the patient's history were reviewed and updated as appropriate: allergies, current medications, past family history, past medical history, past social history, past surgical history and problem list.   Objective:   Vitals:   10/07/22 0916  BP: 130/75  Pulse: (!) 104  Weight: 172 lb 12.8 oz (78.4 kg)    Fetal Status: Fetal Heart Rate (bpm): 154   Movement: Present     General:  Alert, oriented and cooperative. Patient is in no acute distress.  Skin: Skin is warm and dry. No rash noted.   Cardiovascular: Normal heart rate noted  Respiratory: Normal respiratory effort, no problems with respiration noted  Abdomen: Soft, gravid, appropriate for gestational age.  Pain/Pressure: Absent     Pelvic: Cervical exam deferred        Extremities: Normal range of motion.  Edema: None  Mental Status: Normal mood and affect. Normal behavior. Normal judgment and thought content.   Assessment and Plan:  Pregnancy: G2P0010 at [redacted]w[redacted]d 1. Supervision of other normal pregnancy, antepartum - on PNV - recheck 1 week   2. [redacted] weeks gestation of pregnancy  3. History of supraventricular tachycardia  4. UTI (urinary tract infection) during pregnancy, second trimester - urine culture negative last visit for recheck  5. Other iron deficiency  anemia - on oral   Preterm labor symptoms and general obstetric precautions including but not limited to vaginal bleeding, contractions, leaking of fluid and fetal movement were reviewed in detail with the patient. Please refer to After Visit Summary for other counseling recommendations.   Return in about 1 week (around 10/14/2022).  Future Appointments  Date Time Provider [place]  10/15/2022  4:15 PM Megan Salon, MD [facility]  10/22/2022  3:55 PM Megan Salon, MD [facility]  10/29/2022  3:55 PM Megan Salon, MD [facility]  11/04/2022  9:00 AM Lyndal Pulley, DO [facility] None  11/05/2022  3:55 PM Megan Salon, MD [facility]  11/12/2022  3:55 PM Megan Salon, MD [facility]    Megan Salon, MD

## 2022-10-15 ENCOUNTER — Other Ambulatory Visit (HOSPITAL_COMMUNITY)
Admission: RE | Admit: 2022-10-15 | Discharge: 2022-10-15 | Disposition: A | Discharge status: Home / Self Care | Payer: Commercial Managed Care - PPO | Source: Ambulatory Visit | Attending: Obstetrics & Gynecology | Admitting: Obstetrics & Gynecology

## 2022-10-15 ENCOUNTER — Encounter (HOSPITAL_BASED_OUTPATIENT_CLINIC_OR_DEPARTMENT_OTHER): Payer: Self-pay | Admitting: Obstetrics & Gynecology

## 2022-10-15 ENCOUNTER — Ambulatory Visit (INDEPENDENT_AMBULATORY_CARE_PROVIDER_SITE_OTHER): Payer: Commercial Managed Care - PPO | Admitting: Obstetrics & Gynecology

## 2022-10-15 ENCOUNTER — Other Ambulatory Visit (HOSPITAL_BASED_OUTPATIENT_CLINIC_OR_DEPARTMENT_OTHER): Payer: Self-pay

## 2022-10-15 VITALS — BP 122/74 | HR 100 | Wt 172.6 lb

## 2022-10-15 DIAGNOSIS — Z3483 Encounter for supervision of other normal pregnancy, third trimester: Secondary | ICD-10-CM | POA: Diagnosis not present

## 2022-10-15 DIAGNOSIS — Z8679 Personal history of other diseases of the circulatory system: Secondary | ICD-10-CM

## 2022-10-15 DIAGNOSIS — Z3A36 36 weeks gestation of pregnancy: Secondary | ICD-10-CM

## 2022-10-15 DIAGNOSIS — Z3480 Encounter for supervision of other normal pregnancy, unspecified trimester: Secondary | ICD-10-CM

## 2022-10-15 DIAGNOSIS — O2342 Unspecified infection of urinary tract in pregnancy, second trimester: Secondary | ICD-10-CM

## 2022-10-15 MED ORDER — ABRYSVO 120 MCG/0.5ML IM SOLR
INTRAMUSCULAR | 0 refills | Status: DC
  Filled 2022-10-15: qty 0.5, 1d supply, fill #0

## 2022-10-15 NOTE — Progress Notes (Signed)
   PRENATAL VISIT NOTE  Subjective:  Shelby Carr is a 27 y.o. G2P0010 at [redacted]w[redacted]d being seen today for ongoing prenatal care.  She is currently monitored for the following issues for this low-risk pregnancy and has History of supraventricular tachycardia; Supervision of other normal pregnancy, antepartum; UTI (urinary tract infection) during pregnancy, second trimester; Right hip flexor tightness; Somatic dysfunction of spine, lumbar; and COVID-19 affecting pregnancy in third trimester on their problem list.  Patient reports no complaints.  Contractions: Not present. Vag. Bleeding: None.  Movement: Present. Denies leaking of fluid.   The following portions of the patient's history were reviewed and updated as appropriate: allergies, current medications, past family history, past medical history, past social history, past surgical history and problem list.   Objective:   Vitals:   10/15/22 1630  BP: 122/74  Pulse: 100  Weight: 172 lb 9.6 oz (78.3 kg)    Fetal Status: Fetal Heart Rate (bpm): 135 Fundal Height: 36 cm Movement: Present  Presentation: Vertex  General:  Alert, oriented and cooperative. Patient is in no acute distress.  Skin: Skin is warm and dry. No rash noted.   Cardiovascular: Normal heart rate noted  Respiratory: Normal respiratory effort, no problems with respiration noted  Abdomen: Soft, gravid, appropriate for gestational age.  Pain/Pressure: Absent     Pelvic: Cervical exam deferred        Extremities: Normal range of motion.  Edema: None  Mental Status: Normal mood and affect. Normal behavior. Normal judgment and thought content.   Assessment and Plan:  Pregnancy: G2P0010 at [redacted]w[redacted]d 1. Encounter for supervision of other normal pregnancy in third trimester - recheck 1 week - on PNV - Culture, beta strep (group b only) - Cervicovaginal ancillary only( [place])  2. [redacted] weeks gestation of pregnancy  3. History of supraventricular tachycardia - was when she  was in college.  Released from cardiology  4. Supervision of other normal pregnancy, antepartum  5. UTI (urinary tract infection) during pregnancy, second trimester - TOC negative 09/01/2020   Preterm labor symptoms and general obstetric precautions including but not limited to vaginal bleeding, contractions, leaking of fluid and fetal movement were reviewed in detail with the patient. Please refer to After Visit Summary for other counseling recommendations.   Return in about 1 week (around 10/22/2022).  Future Appointments  Date Time Provider [place]  10/22/2022  3:55 PM Megan Salon, MD [facility]  10/29/2022  3:55 PM Megan Salon, MD [facility]  11/04/2022  9:00 AM Lyndal Pulley, DO [facility] None  11/05/2022  3:55 PM Megan Salon, MD [facility]  11/12/2022  3:55 PM Megan Salon, MD [facility]    Megan Salon, MD

## 2022-10-18 LAB — CERVICOVAGINAL ANCILLARY ONLY
Chlamydia: NEGATIVE
Comment: NEGATIVE
Comment: NORMAL
Neisseria Gonorrhea: NEGATIVE

## 2022-10-19 LAB — CULTURE, BETA STREP (GROUP B ONLY): Strep Gp B Culture: NEGATIVE

## 2022-10-20 ENCOUNTER — Other Ambulatory Visit (HOSPITAL_BASED_OUTPATIENT_CLINIC_OR_DEPARTMENT_OTHER): Payer: Self-pay

## 2022-10-22 ENCOUNTER — Ambulatory Visit (INDEPENDENT_AMBULATORY_CARE_PROVIDER_SITE_OTHER): Payer: Commercial Managed Care - PPO | Admitting: Obstetrics & Gynecology

## 2022-10-22 ENCOUNTER — Encounter (HOSPITAL_BASED_OUTPATIENT_CLINIC_OR_DEPARTMENT_OTHER): Payer: Self-pay | Admitting: Obstetrics & Gynecology

## 2022-10-22 VITALS — BP 125/79 | HR 105 | Wt 175.6 lb

## 2022-10-22 DIAGNOSIS — Z3480 Encounter for supervision of other normal pregnancy, unspecified trimester: Secondary | ICD-10-CM

## 2022-10-22 DIAGNOSIS — Z3A37 37 weeks gestation of pregnancy: Secondary | ICD-10-CM

## 2022-10-22 DIAGNOSIS — Z8679 Personal history of other diseases of the circulatory system: Secondary | ICD-10-CM

## 2022-10-22 NOTE — Progress Notes (Signed)
   PRENATAL VISIT NOTE  Subjective:  Shelby Carr is a 27 y.o. G2P0010 at [redacted]w[redacted]d being seen today for ongoing prenatal care.  She is currently monitored for the following issues for this low-risk pregnancy and has History of supraventricular tachycardia; Supervision of other normal pregnancy, antepartum; UTI (urinary tract infection) during pregnancy, second trimester; Right hip flexor tightness; Somatic dysfunction of spine, lumbar; and COVID-19 affecting pregnancy in third trimester on their problem list.  Patient reports no complaints.  Contractions: Not present. Vag. Bleeding: None.  Movement: Absent. Denies leaking of fluid.   The following portions of the patient's history were reviewed and updated as appropriate: allergies, current medications, past family history, past medical history, past social history, past surgical history and problem list.   Objective:   Vitals:   10/22/22 1557  BP: 125/79  Pulse: (!) 105  Weight: 175 lb 9.6 oz (79.7 kg)    Fetal Status: Fetal Heart Rate (bpm): 144 Fundal Height: 37 cm Movement: Absent  Presentation: Vertex  General:  Alert, oriented and cooperative. Patient is in no acute distress.  Skin: Skin is warm and dry. No rash noted.   Cardiovascular: Normal heart rate noted  Respiratory: Normal respiratory effort, no problems with respiration noted  Abdomen: Soft, gravid, appropriate for gestational age.  Pain/Pressure: Absent     Pelvic: Cervical exam deferred        Extremities: Normal range of motion.  Edema: None  Mental Status: Normal mood and affect. Normal behavior. Normal judgment and thought content.   Assessment and Plan:  Pregnancy: G2P0010 at [redacted]w[redacted]d 1. Supervision of other normal pregnancy, antepartum - recheck 1 weeks - GBS negative - has received RSV vaccination  2. History of supraventricular tachycardia - stable in pregnancy  3. [redacted] weeks gestation of pregnancy  Preterm labor symptoms and general obstetric precautions  including but not limited to vaginal bleeding, contractions, leaking of fluid and fetal movement were reviewed in detail with the patient. Please refer to After Visit Summary for other counseling recommendations.   Return in about 1 week (around 10/29/2022).  Future Appointments  Date Time Provider [place]  10/29/2022  3:55 PM Megan Salon, MD [facility]  11/04/2022  9:00 AM Lyndal Pulley, DO [facility] None  11/05/2022  3:55 PM Megan Salon, MD [facility]  11/12/2022  3:55 PM Megan Salon, MD [facility]    Megan Salon, MD

## 2022-10-29 ENCOUNTER — Encounter (HOSPITAL_BASED_OUTPATIENT_CLINIC_OR_DEPARTMENT_OTHER): Payer: Self-pay | Admitting: Obstetrics & Gynecology

## 2022-10-29 ENCOUNTER — Ambulatory Visit (INDEPENDENT_AMBULATORY_CARE_PROVIDER_SITE_OTHER): Payer: Commercial Managed Care - PPO | Admitting: Obstetrics & Gynecology

## 2022-10-29 VITALS — BP 113/82 | HR 107 | Wt 176.4 lb

## 2022-10-29 DIAGNOSIS — Z3480 Encounter for supervision of other normal pregnancy, unspecified trimester: Secondary | ICD-10-CM

## 2022-10-29 DIAGNOSIS — Z8679 Personal history of other diseases of the circulatory system: Secondary | ICD-10-CM

## 2022-10-29 DIAGNOSIS — M24551 Contracture, right hip: Secondary | ICD-10-CM

## 2022-10-29 NOTE — Progress Notes (Signed)
   PRENATAL VISIT NOTE  Subjective:  Shelby Carr is a 27 y.o. G2P0010 at [redacted]w[redacted]d being seen today for ongoing prenatal care.  She is currently monitored for the following issues for this low-risk pregnancy and has History of supraventricular tachycardia; Supervision of other normal pregnancy, antepartum; UTI (urinary tract infection) during pregnancy, second trimester; Right hip flexor tightness; Somatic dysfunction of spine, lumbar; and COVID-19 affecting pregnancy in third trimester on their problem list.  Patient reports no complaints.  Contractions: Not present. Vag. Bleeding: None.  Movement: Present. Denies leaking of fluid.   The following portions of the patient's history were reviewed and updated as appropriate: allergies, current medications, past family history, past medical history, past social history, past surgical history and problem list.   Objective:   Vitals:   10/29/22 1601  BP: 113/82  Pulse: (!) 107  Weight: 176 lb 6.4 oz (80 kg)    Fetal Status: Fetal Heart Rate (bpm): 133   Movement: Present     General:  Alert, oriented and cooperative. Patient is in no acute distress.  Skin: Skin is warm and dry. No rash noted.   Cardiovascular: Normal heart rate noted  Respiratory: Normal respiratory effort, no problems with respiration noted  Abdomen: Soft, gravid, appropriate for gestational age.  Pain/Pressure: Absent     Pelvic: Cervical exam deferred        Extremities: Normal range of motion.  Edema: Trace  Mental Status: Normal mood and affect. Normal behavior. Normal judgment and thought content.   Assessment and Plan:  Pregnancy: G2P0010 at [redacted]w[redacted]d 1. Supervision of other normal pregnancy, antepartum - recheck 1 week - on PNV  2. History of supraventricular tachycardia - stable  3. Right hip flexor tightness - seeing chiropractor    Term labor symptoms and general obstetric precautions including but not limited to vaginal bleeding, contractions, leaking of  fluid and fetal movement were reviewed in detail with the patient. Please refer to After Visit Summary for other counseling recommendations.   Return in about 1 week (around 11/05/2022).  Future Appointments  Date Time Provider [place]  11/04/2022 12:45 PM Lyndal Pulley, DO [facility] None  11/05/2022  3:55 PM Megan Salon, MD [facility]  11/12/2022  3:55 PM Megan Salon, MD [facility]    Megan Salon, MD

## 2022-11-03 NOTE — Progress Notes (Unsigned)
  [place] Phone: [phone] Subjective:   Fontaine No, am serving as a scribe for Dr. Hulan Saas.  I'm seeing this patient by the request  of:  Hali Marry, MD  CC: Low back pain  PXT:[id number]  Shelby Carr is a 27 y.o. female coming in with complaint of back and neck pain. OMT on 10/06/22. Patient states that she is doing well since last visit. No pain in spine.  Patient states just tightness overall.  Nothing that is severe.  Medications patient has been prescribed: None  Taking:       Past Medical History:  Diagnosis Date   Anemia    History of supraventricular tachycardia    in high school      Objective  Blood pressure 102/62, pulse 97, height 5\' 5"  (1.651 m), weight 179 lb (81.2 kg), last menstrual period 02/02/2022, SpO2 98 %, unknown if currently breastfeeding.   General: No apparent distress alert and oriented x3 mood and affect normal, dressed appropriately.  HEENT: Pupils equal, extraocular movements intact  Respiratory: Patient's speak in full sentences and does not appear short of breath  Low back exam does have some loss of lordosis.  Patient does have some excessive lordosis to the right at the sacral area.  Patient does have tightness of the right greater than left  Osteopathic findings   L2 flexed rotated and side bent right Sacrum right on right       Assessment and Plan:  Right hip flexor tightness Continues to have tightness noted.  Patient secondary to the gravid stomach we did avoid HVLA on the lumbar spine noted.  Did more of a muscle energy and subarticular technique.  Hopeful that this will be helpful.  Discussed with patient we could see her again in 2 weeks otherwise we will wait till 6 weeks postpartum.  Patient is in agreement with the plan.  Patient has done well keeping her core strength and I think patient will do well with delivery     Nonallopathic problems  Decision today to treat with OMT was based on Physical Exam  After verbal consent patient was treated with HVLA, ME, FPR techniques in cervical, rib, thoracic, lumbar, and sacral  areas  Patient tolerated the procedure well with improvement in symptoms  Patient given exercises, stretches and lifestyle modifications  See medications in patient instructions if given  Patient will follow up in 4-8 weeks     The above documentation has been reviewed and is accurate and complete Lyndal Pulley, DO         Note: This dictation was prepared with Dragon dictation along with smaller phrase technology. Any transcriptional errors that result from this process are unintentional.

## 2022-11-04 ENCOUNTER — Ambulatory Visit: Payer: Commercial Managed Care - PPO | Admitting: Family Medicine

## 2022-11-04 ENCOUNTER — Encounter: Payer: Self-pay | Admitting: Family Medicine

## 2022-11-04 VITALS — BP 102/62 | HR 97 | Ht 65.0 in | Wt 179.0 lb

## 2022-11-04 DIAGNOSIS — M9903 Segmental and somatic dysfunction of lumbar region: Secondary | ICD-10-CM

## 2022-11-04 DIAGNOSIS — M9904 Segmental and somatic dysfunction of sacral region: Secondary | ICD-10-CM

## 2022-11-04 DIAGNOSIS — M24551 Contracture, right hip: Secondary | ICD-10-CM

## 2022-11-04 NOTE — Assessment & Plan Note (Signed)
Continues to have tightness noted.  Patient secondary to the gravid stomach we did avoid HVLA on the lumbar spine noted.  Did more of a muscle energy and subarticular technique.  Hopeful that this will be helpful.  Discussed with patient we could see her again in 2 weeks otherwise we will wait till 6 weeks postpartum.  Patient is in agreement with the plan.  Patient has done well keeping her core strength and I think patient will do well with delivery

## 2022-11-05 ENCOUNTER — Ambulatory Visit (INDEPENDENT_AMBULATORY_CARE_PROVIDER_SITE_OTHER): Payer: Commercial Managed Care - PPO | Admitting: Obstetrics & Gynecology

## 2022-11-05 VITALS — BP 116/78 | HR 96 | Wt 179.0 lb

## 2022-11-05 DIAGNOSIS — Z3A39 39 weeks gestation of pregnancy: Secondary | ICD-10-CM

## 2022-11-05 DIAGNOSIS — O99013 Anemia complicating pregnancy, third trimester: Secondary | ICD-10-CM

## 2022-11-05 DIAGNOSIS — Z3483 Encounter for supervision of other normal pregnancy, third trimester: Secondary | ICD-10-CM

## 2022-11-05 DIAGNOSIS — U071 COVID-19: Secondary | ICD-10-CM

## 2022-11-05 DIAGNOSIS — O2342 Unspecified infection of urinary tract in pregnancy, second trimester: Secondary | ICD-10-CM

## 2022-11-05 DIAGNOSIS — Z3480 Encounter for supervision of other normal pregnancy, unspecified trimester: Secondary | ICD-10-CM

## 2022-11-05 DIAGNOSIS — Z8679 Personal history of other diseases of the circulatory system: Secondary | ICD-10-CM

## 2022-11-05 DIAGNOSIS — O98513 Other viral diseases complicating pregnancy, third trimester: Secondary | ICD-10-CM

## 2022-11-06 ENCOUNTER — Telehealth (HOSPITAL_BASED_OUTPATIENT_CLINIC_OR_DEPARTMENT_OTHER): Payer: Self-pay | Admitting: *Deleted

## 2022-11-06 ENCOUNTER — Ambulatory Visit (INDEPENDENT_AMBULATORY_CARE_PROVIDER_SITE_OTHER): Payer: Commercial Managed Care - PPO | Admitting: Obstetrics & Gynecology

## 2022-11-06 ENCOUNTER — Other Ambulatory Visit (HOSPITAL_BASED_OUTPATIENT_CLINIC_OR_DEPARTMENT_OTHER): Payer: Self-pay | Admitting: Obstetrics & Gynecology

## 2022-11-06 VITALS — BP 133/87 | HR 120 | Wt 177.2 lb

## 2022-11-06 DIAGNOSIS — N898 Other specified noninflammatory disorders of vagina: Secondary | ICD-10-CM

## 2022-11-06 DIAGNOSIS — Z3A39 39 weeks gestation of pregnancy: Secondary | ICD-10-CM

## 2022-11-06 DIAGNOSIS — O99013 Anemia complicating pregnancy, third trimester: Secondary | ICD-10-CM

## 2022-11-06 DIAGNOSIS — Z3490 Encounter for supervision of normal pregnancy, unspecified, unspecified trimester: Secondary | ICD-10-CM

## 2022-11-06 DIAGNOSIS — O480 Post-term pregnancy: Secondary | ICD-10-CM

## 2022-11-06 DIAGNOSIS — Z8679 Personal history of other diseases of the circulatory system: Secondary | ICD-10-CM | POA: Diagnosis not present

## 2022-11-06 DIAGNOSIS — Z3483 Encounter for supervision of other normal pregnancy, third trimester: Secondary | ICD-10-CM

## 2022-11-06 LAB — POCT FERNING: Ferning, POC: NEGATIVE

## 2022-11-06 NOTE — Progress Notes (Signed)
   PRENATAL VISIT NOTE  Subjective:  Shelby Carr is a 27 y.o. G2P0010 at [redacted]w[redacted]d being seen today for ongoing prenatal care.  She is currently monitored for the following issues for this low-risk pregnancy and has History of supraventricular tachycardia; Supervision of other normal pregnancy, antepartum; Anemia affecting pregnancy in third trimester; UTI (urinary tract infection) during pregnancy, second trimester; Right hip flexor tightness; Somatic dysfunction of spine, lumbar; and COVID-19 affecting pregnancy in third trimester on their problem list.  Patient reports  she's getting tired and is ready to have her baby .  Contractions: Not present. Vag. Bleeding: None.  Movement: Present. Denies leaking of fluid.   The following portions of the patient's history were reviewed and updated as appropriate: allergies, current medications, past family history, past medical history, past social history, past surgical history and problem list.   Objective:   Vitals:   11/05/22 1616  BP: 116/78  Pulse: 96  Weight: 179 lb (81.2 kg)    Fetal Status: Fetal Heart Rate (bpm): 135 Fundal Height: 39 cm Movement: Present     General:  Alert, oriented and cooperative. Patient is in no acute distress.  Skin: Skin is warm and dry. No rash noted.   Cardiovascular: Normal heart rate noted  Respiratory: Normal respiratory effort, no problems with respiration noted  Abdomen: Soft, gravid, appropriate for gestational age.  Pain/Pressure: Absent     Pelvic: Cervical exam performed in the presence of a chaperone Dilation: 1.5 Effacement (%): 40 Station: -3  Extremities: Normal range of motion.  Edema: Trace  Mental Status: Normal mood and affect. Normal behavior. Normal judgment and thought content.   Assessment and Plan:  Pregnancy: G2P0010 at [redacted]w[redacted]d 1. Supervision of other normal pregnancy, antepartum - on PNV  2. Prenatal care, subsequent pregnancy, third trimester - induction at 41 weeks is  scheduled - [place] MFM FETAL BPP WO NON STRESS; Future  3. History of supraventricular tachycardia - no issues with pregnancy  4. UTI (urinary tract infection) during pregnancy, second trimester - had UTI second trimester, repeat culture negative  5. COVID-19 affecting pregnancy in third trimester - has fully recovered  6. Anemia in 3rd trimester - on iron  Term labor symptoms and general obstetric precautions including but not limited to vaginal bleeding, contractions, leaking of fluid and fetal movement were reviewed in detail with the patient. Please refer to After Visit Summary for other counseling recommendations.   Return in about 1 week (around 11/12/2022).  Future Appointments  Date Time Provider [place]  11/10/2022  8:30 AM [facility] NURSE [facility]  11/10/2022  8:45 AM [facility] US6 [facility]  11/12/2022  3:55 PM Megan Salon, MD [facility]  11/15/2022  7:00 AM [facility] SCHED ROOM [facility] None    Megan Salon, MD

## 2022-11-06 NOTE — Telephone Encounter (Signed)
Pt called stating that after she used the bathroom, while brushing her teeth she had a large gush of fluid. It has not happened again. She reports good fetal movement, denies contractions or bleeding. Advised that she should come into the office for evaluation.

## 2022-11-08 NOTE — Progress Notes (Signed)
   PRENATAL VISIT NOTE  Subjective:  Shelby Carr is a 27 y.o. G2P0010 at [redacted]w[redacted]d being seen today out of regular ob visit cycle appointments for concerns for ROM.  Reports had a small gush of what felt like fluid but then nothing else.  Not sure if urine or vaginal discharge.  No vaginal bleeding.  She is currently monitored for the following issues for this low-risk pregnancy and has History of supraventricular tachycardia; Supervision of other normal pregnancy, antepartum; Anemia affecting pregnancy in third trimester; UTI (urinary tract infection) during pregnancy, second trimester; Right hip flexor tightness; Somatic dysfunction of spine, lumbar; and COVID-19 affecting pregnancy in third trimester on their problem list.  Patient reports  the above as noted .  Contractions: Not present. Vag. Bleeding: None.  Movement: Present.   The following portions of the patient's history were reviewed and updated as appropriate: allergies, current medications, past family history, past medical history, past social history, past surgical history and problem list.   Objective:   Vitals:   11/06/22 1147  BP: 133/87  Pulse: (!) 120  Weight: 177 lb 3.2 oz (80.4 kg)    Fetal Status: Fetal Heart Rate (bpm): 147 Fundal Height: 39 cm Movement: Present     General:  Alert, oriented and cooperative. Patient is in no acute distress.  Skin: Skin is warm and dry. No rash noted.   Cardiovascular: Normal heart rate noted  Respiratory: Normal respiratory effort, no problems with respiration noted  Abdomen: Soft, gravid, appropriate for gestational age.  Pain/Pressure: Absent     Pelvic: NAEFG, vaginal without lesions, no fluid noted, whitish vaginal discharge present.  Ph:   paper yellow, ph <6, fern negative       Extremities: Normal range of motion.  Edema: None  Mental Status: Normal mood and affect. Normal behavior. Normal judgment and thought content.   Assessment and Plan:  Pregnancy: G2P0010 at  [redacted]w[redacted]d 1. Vaginal discharge - findings on exam are not c/w ROM.  Pt reassured. - POCT Ferning was negative and PH <6.  Findings discussed.  2. Prenatal care, subsequent pregnancy, third trimester - has appt this next week for foley bulb placement and induction scheduled for post dates  3. Anemia affecting pregnancy in third trimester - on iron  4. History of supraventricular tachycardia  5. [redacted] weeks gestation of pregnancy   Term labor symptoms and general obstetric precautions including but not limited to vaginal bleeding, contractions, leaking of fluid and fetal movement were reviewed in detail with the patient. Please refer to After Visit Summary for other counseling recommendations.   Return in about 1 week (around 11/13/2022).  Future Appointments  Date Time Provider [place]  11/10/2022  8:30 AM [facility] NURSE [facility]  11/10/2022  8:45 AM [facility] US6 [facility]  11/12/2022  3:55 PM Megan Salon, MD [facility]  11/15/2022  7:00 AM [facility] SCHED ROOM [facility] None    Megan Salon, MD

## 2022-11-09 ENCOUNTER — Encounter (HOSPITAL_COMMUNITY): Payer: Self-pay

## 2022-11-09 ENCOUNTER — Telehealth (HOSPITAL_COMMUNITY): Payer: Self-pay | Admitting: *Deleted

## 2022-11-09 ENCOUNTER — Other Ambulatory Visit (HOSPITAL_COMMUNITY): Payer: Self-pay | Admitting: Advanced Practice Midwife

## 2022-11-09 NOTE — Telephone Encounter (Signed)
Preadmission screen  

## 2022-11-10 ENCOUNTER — Ambulatory Visit: Payer: Commercial Managed Care - PPO | Admitting: *Deleted

## 2022-11-10 ENCOUNTER — Ambulatory Visit (HOSPITAL_BASED_OUTPATIENT_CLINIC_OR_DEPARTMENT_OTHER): Payer: Commercial Managed Care - PPO

## 2022-11-10 ENCOUNTER — Encounter: Payer: Self-pay | Admitting: *Deleted

## 2022-11-10 ENCOUNTER — Other Ambulatory Visit (HOSPITAL_BASED_OUTPATIENT_CLINIC_OR_DEPARTMENT_OTHER): Payer: Self-pay | Admitting: Obstetrics & Gynecology

## 2022-11-10 ENCOUNTER — Ambulatory Visit: Payer: Commercial Managed Care - PPO

## 2022-11-10 DIAGNOSIS — U071 COVID-19: Secondary | ICD-10-CM

## 2022-11-10 DIAGNOSIS — Z8679 Personal history of other diseases of the circulatory system: Secondary | ICD-10-CM

## 2022-11-10 DIAGNOSIS — Z3483 Encounter for supervision of other normal pregnancy, third trimester: Secondary | ICD-10-CM

## 2022-11-10 DIAGNOSIS — O99013 Anemia complicating pregnancy, third trimester: Secondary | ICD-10-CM

## 2022-11-10 DIAGNOSIS — O2342 Unspecified infection of urinary tract in pregnancy, second trimester: Secondary | ICD-10-CM

## 2022-11-10 DIAGNOSIS — Z3480 Encounter for supervision of other normal pregnancy, unspecified trimester: Secondary | ICD-10-CM

## 2022-11-11 ENCOUNTER — Other Ambulatory Visit: Payer: Self-pay | Admitting: Advanced Practice Midwife

## 2022-11-11 ENCOUNTER — Telehealth (HOSPITAL_COMMUNITY): Payer: Self-pay | Admitting: *Deleted

## 2022-11-11 ENCOUNTER — Encounter (HOSPITAL_COMMUNITY): Payer: Self-pay | Admitting: *Deleted

## 2022-11-11 NOTE — Telephone Encounter (Signed)
Preadmission screen  

## 2022-11-12 ENCOUNTER — Ambulatory Visit (INDEPENDENT_AMBULATORY_CARE_PROVIDER_SITE_OTHER): Payer: Commercial Managed Care - PPO | Admitting: Obstetrics & Gynecology

## 2022-11-12 VITALS — BP 116/87 | HR 97 | Wt 175.6 lb

## 2022-11-12 DIAGNOSIS — Z8679 Personal history of other diseases of the circulatory system: Secondary | ICD-10-CM | POA: Diagnosis not present

## 2022-11-12 DIAGNOSIS — O480 Post-term pregnancy: Secondary | ICD-10-CM | POA: Diagnosis not present

## 2022-11-12 DIAGNOSIS — O99013 Anemia complicating pregnancy, third trimester: Secondary | ICD-10-CM | POA: Diagnosis not present

## 2022-11-12 DIAGNOSIS — Z3A40 40 weeks gestation of pregnancy: Secondary | ICD-10-CM

## 2022-11-12 DIAGNOSIS — Z3480 Encounter for supervision of other normal pregnancy, unspecified trimester: Secondary | ICD-10-CM

## 2022-11-12 NOTE — Progress Notes (Signed)
   PRENATAL VISIT NOTE  Subjective:  Shelby Carr is a 27 y.o. G2P0010 at [redacted]w[redacted]d being seen today for ongoing prenatal care.  She is currently monitored for the following issues for this low-risk pregnancy and has History of supraventricular tachycardia; Supervision of other normal pregnancy, antepartum; Anemia affecting pregnancy in third trimester; UTI (urinary tract infection) during pregnancy, second trimester; Right hip flexor tightness; Somatic dysfunction of spine, lumbar; and COVID-19 affecting pregnancy in third trimester on their problem list.  Patient reports  she's ready to have her baby.  Induction scheduled tomorrow.  She is here for foley bulb placement today .  Contractions: Irregular. Vag. Bleeding: None.  Movement: Present. Denies leaking of fluid.   The following portions of the patient's history were reviewed and updated as appropriate: allergies, current medications, past family history, past medical history, past social history, past surgical history and problem list.   Objective:   Vitals:   11/12/22 1706  BP: 116/87  Pulse: 97  Weight: 175 lb 9.6 oz (79.7 kg)    Fetal Status: Fetal Heart Rate (bpm): 156 Fundal Height: 40 cm Movement: Present  Presentation: Vertex  General:  Alert, oriented and cooperative. Patient is in no acute distress.  Skin: Skin is warm and dry. No rash noted.   Cardiovascular: Normal heart rate noted  Respiratory: Normal respiratory effort, no problems with respiration noted  Abdomen: Soft, gravid, appropriate for gestational age.  Pain/Pressure: Absent     Pelvic: Cervical exam performed in the presence of a chaperone Dilation: 1.5 Effacement (%): 20 Station: -3  Extremities: Normal range of motion.  Edema: None  Mental Status: Normal mood and affect. Normal behavior. Normal judgment and thought content.   Procedure: Patient informed of Risks, benefits, and alternatives of procedure.   Procedure done to begin ripening of the cervix  prior to admission for induction of labor. Appropriate time out taken. The patient was placed in the lithotomy position and the cervix brought into view with sterile speculum. A ring forcep was used to guide the 61F foley", Cook Catheter through the internal os of the cervix. Foley Balloon filled with  40 cc of normal saline. Plug inserted into end of the foley. Foley placed on tension and taped to medial thigh. All equipment was removed and accounted for. The patient tolerated the procedure well.   NST:  EFM Baseline: 120-130 bpm, Variability: Good {> 6 bpm), Accelerations: Reactive, and Decelerations: Absent  Toco: irregular, every 3-4 minutes There were no signs of tachysystole or hypertonus.   Assessment and Plan:  Pregnancy: G2P0010 at [redacted]w[redacted]d 1. Supervision of other normal pregnancy, antepartum - Induction scheduled tomorrow.   - Orders placed. - s/p foley bulb placement.  Reassuring FHR tracing with no concerns at present. Warning signs given to patient to include return to MAU for heavy vaginal bleeding, Rupture of membranes, painful uterine contractions q 5 mins or less, severe abdominal discomfort, decreased fetal movement.  2. Post-term pregnancy, 40-42 weeks of gestation  3. Anemia affecting pregnancy in third trimester - on oral iron  4. History of supraventricular tachycardia    No follow-ups on file.   Megan Salon, MD 11/12/2022 10:49 PM

## 2022-11-13 ENCOUNTER — Observation Stay (HOSPITAL_COMMUNITY): Payer: Commercial Managed Care - PPO | Admitting: Anesthesiology

## 2022-11-13 ENCOUNTER — Inpatient Hospital Stay (HOSPITAL_COMMUNITY): Payer: Commercial Managed Care - PPO

## 2022-11-13 ENCOUNTER — Encounter (HOSPITAL_COMMUNITY): Payer: Self-pay | Admitting: Obstetrics & Gynecology

## 2022-11-13 ENCOUNTER — Inpatient Hospital Stay (HOSPITAL_COMMUNITY)
Admission: RE | Admit: 2022-11-13 | Discharge: 2022-11-16 | DRG: 807 | Disposition: A | Discharge status: Home / Self Care | Payer: Commercial Managed Care - PPO | Attending: Obstetrics & Gynecology | Admitting: Obstetrics & Gynecology

## 2022-11-13 DIAGNOSIS — O9902 Anemia complicating childbirth: Secondary | ICD-10-CM | POA: Diagnosis present

## 2022-11-13 DIAGNOSIS — Z8616 Personal history of COVID-19: Secondary | ICD-10-CM | POA: Diagnosis not present

## 2022-11-13 DIAGNOSIS — Z3A40 40 weeks gestation of pregnancy: Secondary | ICD-10-CM

## 2022-11-13 DIAGNOSIS — Z3490 Encounter for supervision of normal pregnancy, unspecified, unspecified trimester: Secondary | ICD-10-CM

## 2022-11-13 DIAGNOSIS — O480 Post-term pregnancy: Principal | ICD-10-CM | POA: Diagnosis present

## 2022-11-13 LAB — CBC
HCT: 38.8 % (ref 36.0–46.0)
Hemoglobin: 12.9 g/dL (ref 12.0–15.0)
MCH: 28.3 pg (ref 26.0–34.0)
MCHC: 33.2 g/dL (ref 30.0–36.0)
MCV: 85.1 fL (ref 80.0–100.0)
Platelets: 162 10*3/uL (ref 150–400)
RBC: 4.56 MIL/uL (ref 3.87–5.11)
RDW: 15.9 % — ABNORMAL HIGH (ref 11.5–15.5)
WBC: 7.2 10*3/uL (ref 4.0–10.5)
nRBC: 0 % (ref 0.0–0.2)

## 2022-11-13 LAB — TYPE AND SCREEN
ABO/RH(D): A POS
Antibody Screen: NEGATIVE

## 2022-11-13 LAB — RPR: RPR Ser Ql: NONREACTIVE

## 2022-11-13 MED ORDER — DIPHENHYDRAMINE HCL 50 MG/ML IJ SOLN: 12.5000 mg | INTRAMUSCULAR | Status: DC | PRN

## 2022-11-13 MED ORDER — FENTANYL-BUPIVACAINE-NACL 0.5-0.125-0.9 MG/250ML-% EP SOLN
12.0000 mL/h | EPIDURAL | Status: DC | PRN
  Administered 2022-11-13: 12 mL/h via EPIDURAL
  Filled 2022-11-13: qty 250

## 2022-11-13 MED ORDER — OXYTOCIN BOLUS FROM INFUSION: 333.0000 mL | Freq: Once | INTRAVENOUS | Status: DC

## 2022-11-13 MED ORDER — MISOPROSTOL 50MCG HALF TABLET: 50.0000 ug | ORAL_TABLET | Freq: Once | ORAL | Status: DC

## 2022-11-13 MED ORDER — EPHEDRINE 5 MG/ML INJ: 10.0000 mg | INTRAVENOUS | Status: DC | PRN

## 2022-11-13 MED ORDER — FLEET ENEMA 7-19 GM/118ML RE ENEM: 1.0000 | ENEMA | RECTAL | Status: DC | PRN

## 2022-11-13 MED ORDER — TERBUTALINE SULFATE 1 MG/ML IJ SOLN: 0.2500 mg | Freq: Once | INTRAMUSCULAR | Status: DC | PRN

## 2022-11-13 MED ORDER — PHENYLEPHRINE 80 MCG/ML (10ML) SYRINGE FOR IV PUSH (FOR BLOOD PRESSURE SUPPORT): 80.0000 ug | PREFILLED_SYRINGE | INTRAVENOUS | Status: DC | PRN

## 2022-11-13 MED ORDER — LACTATED RINGERS IV SOLN: 500.0000 mL | INTRAVENOUS | Status: DC | PRN

## 2022-11-13 MED ORDER — ACETAMINOPHEN 325 MG PO TABS: 650.0000 mg | ORAL_TABLET | ORAL | Status: DC | PRN

## 2022-11-13 MED ORDER — FENTANYL CITRATE (PF) 100 MCG/2ML IJ SOLN: 100.0000 ug | Freq: Once | INTRAMUSCULAR | Status: DC

## 2022-11-13 MED ORDER — LACTATED RINGERS IV SOLN: 500.0000 mL | Freq: Once | INTRAVENOUS | Status: DC

## 2022-11-13 MED ORDER — LACTATED RINGERS IV SOLN: INTRAVENOUS | Status: DC

## 2022-11-13 MED ORDER — ONDANSETRON HCL 4 MG/2ML IJ SOLN: 4.0000 mg | Freq: Four times a day (QID) | INTRAMUSCULAR | Status: DC | PRN

## 2022-11-13 MED ORDER — OXYCODONE-ACETAMINOPHEN 5-325 MG PO TABS: 1.0000 | ORAL_TABLET | ORAL | Status: DC | PRN

## 2022-11-13 MED ORDER — OXYTOCIN-SODIUM CHLORIDE 30-0.9 UT/500ML-% IV SOLN
1.0000 m[IU]/min | INTRAVENOUS | Status: DC
  Administered 2022-11-13: 2 m[IU]/min via INTRAVENOUS
  Filled 2022-11-13: qty 500

## 2022-11-13 MED ORDER — FENTANYL CITRATE (PF) 100 MCG/2ML IJ SOLN
INTRAMUSCULAR | Status: AC
  Administered 2022-11-13: 100 ug
  Filled 2022-11-13: qty 2

## 2022-11-13 MED ORDER — OXYTOCIN-SODIUM CHLORIDE 30-0.9 UT/500ML-% IV SOLN: 1.0000 m[IU]/min | INTRAVENOUS | Status: DC

## 2022-11-13 MED ORDER — LIDOCAINE-EPINEPHRINE (PF) 2 %-1:200000 IJ SOLN
INTRAMUSCULAR | Status: DC | PRN
  Administered 2022-11-13: 5 mL via EPIDURAL

## 2022-11-13 MED ORDER — SOD CITRATE-CITRIC ACID 500-334 MG/5ML PO SOLN: 30.0000 mL | ORAL | Status: DC | PRN

## 2022-11-13 MED ORDER — OXYCODONE-ACETAMINOPHEN 5-325 MG PO TABS: 2.0000 | ORAL_TABLET | ORAL | Status: DC | PRN

## 2022-11-13 MED ORDER — OXYTOCIN-SODIUM CHLORIDE 30-0.9 UT/500ML-% IV SOLN: 2.5000 [IU]/h | INTRAVENOUS | Status: DC

## 2022-11-13 MED ORDER — LIDOCAINE HCL (PF) 1 % IJ SOLN: 30.0000 mL | INTRAMUSCULAR | Status: DC | PRN

## 2022-11-13 NOTE — Progress Notes (Signed)
Patient ID: Shelby Carr, female   DOB: Oct 31, 1995, 27 y.o.   MRN: [id number]  Comfortable w epidural  BPs 129/78, 118/76 FHR 125-135, +accels, occ  mi variables, +LTV Ctx q 1-3 mins with Pit at 86m/min Cx deferred  IUP@40$ .4wks IOL process  -Plan to check cx around 2330 in the absence of symptoms that she is progressing quicker -Anticipate vag del  KMyrtis SerCNM 11/13/2022

## 2022-11-13 NOTE — Anesthesia Preprocedure Evaluation (Signed)
Anesthesia Evaluation  Patient identified by MRN, date of birth, ID band Patient awake    Reviewed: Allergy & Precautions, Patient's Chart, lab work & pertinent test results  Airway Mallampati: II       Dental no notable dental hx.    Pulmonary neg pulmonary ROS   Pulmonary exam normal        Cardiovascular negative cardio ROS Normal cardiovascular exam     Neuro/Psych negative neurological ROS  negative psych ROS   GI/Hepatic negative GI ROS, Neg liver ROS,,,  Endo/Other  negative endocrine ROS    Renal/GU negative Renal ROS  negative genitourinary   Musculoskeletal negative musculoskeletal ROS (+)    Abdominal Normal abdominal exam  (+)   Peds  Hematology  (+) Blood dyscrasia, anemia   Anesthesia Other Findings   Reproductive/Obstetrics (+) Pregnancy                             Anesthesia Physical Anesthesia Plan  ASA: 2  Anesthesia Plan: Epidural   Post-op Pain Management: Minimal or no pain anticipated   Induction: Intravenous  PONV Risk Score and Plan:   Airway Management Planned: Natural Airway  Additional Equipment:   Intra-op Plan:   Post-operative Plan:   Informed Consent: I have reviewed the patients History and Physical, chart, labs and discussed the procedure including the risks, benefits and alternatives for the proposed anesthesia with the patient or authorized representative who has indicated his/her understanding and acceptance.       Plan Discussed with: Anesthesiologist  Anesthesia Plan Comments:        Anesthesia Quick Evaluation

## 2022-11-13 NOTE — H&P (Cosign Needed Addendum)
OBSTETRIC ADMISSION HISTORY AND PHYSICAL  Shelby Carr is a 27 y.o. female G2P0010 with IUP at 69w4dby LMP presenting for IOL for postdates. She reports +FMs, No LOF, no VB, no blurry vision, headaches or peripheral edema, and RUQ pain.  She plans on breast feeding. She request condoms for birth control. [facility] --->  Estimated Date of Delivery: 11/09/22  Sono: @[redacted]w[redacted]d$ , CWD, normal anatomy, cephalic presentation, [id number] 83% EFW   Prenatal History/Complications: Anemia - on oral iron H/o SVT  Nursing Staff Provider  Office Location  [facility] [place]  Early U/S and LMP  Language  English Anatomy [place] 06/15/2022 spine f/u coimpleted 07/13/2022. normal  Flu Vaccine  07/29/2022 Genetic/Carrier Screen  NIPS:   NML female AFP:  declined Horizon:NML  TDaP Vaccine   08/17/2022 Hgb A1C or  GTT Early  Third trimester: Glucose, Fasting 70 - 91 mg/dL 81  Glucose, 1 hour 70 - 179 mg/dL 98  Glucose, 2 hour 70 - 152 mg/dL 101    COVID Vaccine Pt has had vaccine   LAB RESULTS   Rhogam  N/a Blood Type A/RH(D) POSITIVE/-- (07/13 1417)   Baby Feeding Plan breast Antibody NO ANTIBODIES DETECTED (07/13 1417)  Contraception Considering condoms Rubella 1.32 (07/13 1417)  Circumcision NA RPR Non Reactive (11/13 0851)   Pediatrician  [facility] HBsAg NON-REACTIVE (07/13 1417)   Support Person JEdison NasutiHCVAb Negative  Prenatal Classes Info given HIV Non Reactive (11/13 0851)     BTL Consent   GBS Negative/-- (01/11 1648)  VBAC Consent   Pap 7/20 NML> ASCUS w/ neg HPV 05/08/2021           DME Rx [ ]$  BP cuff [ ]$  Weight Scale Waterbirth  [ ]$  Class [ ]$  Consent [ ]$  CNM visit  PHQ9 & GAD7 [  ] new OB [x]$  28 weeks  [ x] 36 weeks Induction  [ x] Orders Entered--post dates [ ]$ Foley Y/N    Past Medical History: Past Medical History:  Diagnosis Date   Anemia    History of supraventricular tachycardia    in high school    Past Surgical History: Past  Surgical History:  Procedure Laterality Date   TONSILLECTOMY AND ADENOIDECTOMY  10/05/1998   WISDOM TOOTH EXTRACTION Bilateral     Obstetrical History: OB History     Gravida  2   Para  0   Term  0   Preterm  0   AB  1   Living  0      SAB  1   IAB  0   Ectopic  0   Multiple  0   Live Births  0           Social History Social History   Socioeconomic History   Marital status: Married    Spouse name: Not on file   Number of children: Not on file   Years of education: Not on file   Highest education level: Not on file  Occupational History   Occupation: [employer]  Tobacco Use   Smoking status: Never   Smokeless tobacco: Never  Vaping Use   Vaping Use: Never used  Substance and Sexual Activity   Alcohol use: No   Drug use: No   Sexual activity: Yes    Birth control/protection: Condom  Other Topics Concern   Not on file  Social History Narrative   Not on file  Social Determinants of Health   Financial Resource Strain: Not on file  Food Insecurity: No Food Insecurity (11/13/2022)   Hunger Vital Sign    Worried About Running Out of Food in the Last Year: Never true    Ran Out of Food in the Last Year: Never true  Transportation Needs: No Transportation Needs (11/13/2022)   PRAPARE - Hydrologist (Medical): No    Lack of Transportation (Non-Medical): No  Physical Activity: Not on file  Stress: Not on file  Social Connections: Not on file    Family History: Family History  Problem Relation Age of Onset   Healthy Mother    Healthy Father    Alcohol abuse Maternal Grandmother    Hyperlipidemia Maternal Grandfather    Heart attack Paternal Grandfather    Hypertension Paternal Grandfather    Liver disease Paternal Grandfather     Allergies: No Known Allergies  Medications Prior to Admission  Medication Sig Dispense Refill Last Dose   Ascorbic Acid (VITAMIN C PO) Take by mouth.   11/12/2022   ferrous  sulfate 324 MG TBEC Take 324 mg by mouth.   11/12/2022   Prenatal Vit-Fe Fumarate-FA (PRENATAL VITAMIN PO) Take by mouth.   11/13/2022   RSV bivalent vaccine (ABRYSVO) 120 MCG/0.5ML injection Inject into the muscle. 0.5 mL 0 Past Month     Review of Systems   All systems reviewed and negative except as stated in HPI  Blood pressure 122/77, pulse (!) 105, temperature 98.7 F (37.1 C), temperature source Oral, resp. rate 18, height 5' 5"$  (1.651 m), weight 79.4 kg, last menstrual period 02/02/2022, SpO2 99 %, unknown if currently breastfeeding. General appearance: alert, cooperative, and appears stated age Lungs: clear to auscultation bilaterally Heart: regular rate and rhythm Abdomen: soft, non-tender; bowel sounds normal Extremities: Homans sign is negative, no sign of DVT Presentation: cephalic Fetal monitoringBaseline: 150 bpm, Variability: Good {> 6 bpm), Accelerations: Reactive, and Decelerations: Absent Uterine activity: none     Prenatal labs: ABO, Rh: --/--/PENDING (02/09 1040) Antibody: PENDING (02/09 1040) Rubella: 1.32 (07/13 1417) RPR: Non Reactive (11/13 0851)  HBsAg: NON-REACTIVE (07/13 1417)  HIV: Non Reactive (11/13 0851)  GBS: Negative/-- (01/11 1648)  1 hr Glucola: normal (08/17/2022) Genetic screening: normal (06/09/2022) Anatomy US: normal (06/15/2022)  Prenatal Transfer Tool  Maternal Diabetes: No Genetic Screening: Normal Maternal Ultrasounds/Referrals: Normal Fetal Ultrasounds or other Referrals:  None Maternal Substance Abuse:  No Significant Maternal Medications:  None Significant Maternal Lab Results: Group B Strep negative  Results for orders placed or performed during the hospital encounter of 11/13/22 (from the past 24 hour(s))  Type and screen   Collection Time: 11/13/22 10:40 AM  Result Value Ref Range   ABO/RH(D) PENDING    Antibody Screen PENDING    Sample Expiration      11/16/2022,2359 Performed at [facility], [address]  [place], [place] [address]     Patient Active Problem List   Diagnosis Date Noted   Post-dates pregnancy 11/13/2022   COVID-19 affecting pregnancy in third trimester 09/15/2022   Right hip flexor tightness 08/26/2022   Somatic dysfunction of spine, lumbar 08/26/2022   UTI (urinary tract infection) during pregnancy, second trimester 08/21/2022   Anemia affecting pregnancy in third trimester 05/10/2021   Supervision of other normal pregnancy, antepartum 05/05/2021   History of supraventricular tachycardia 04/04/2013    Assessment/Plan:  FARIHA IBARRA is a 27 y.o. G2P0010 at 66w4dhere for IOL for  post dates  #Labor: OP Foley placed and is now out. Denies contractions. Plan to start Pitocin 2x2. #Pain: Epidural upon request #FWB: Cat 1 #ID:  GBS neg #MOF: Breast feeding #MOC: Condoms #Circ:  N/A  Colletta Maryland, MD  11/13/2022, 11:11 AM   Attestation of Supervision of Student:  I confirm that I have verified the information documented in the  resident's  note and that I have also personally reperformed the history, physical exam and all medical decision making activities.  I have verified that all services and findings are accurately documented in this student's note; and I agree with management and plan as outlined in the documentation. I have also made any necessary editorial changes.  Marcille Buffy DNP, CNM  11/13/22  1:15 PM

## 2022-11-13 NOTE — Progress Notes (Signed)
Shelby Carr is a 27 y.o. G2P0010 at 16w4dadmitted for induction of labor due to Post dates. Due date 11/09/2022.  Subjective: Has been walking the unit a lot. Not too uncomfortable with contractions, but she does feel them.   Objective: BP 105/68   Pulse 96   Temp 97.6 F (36.4 C) (Oral)   Resp 18   Ht 5' 5"$  (1.651 m)   Wt 79.4 kg   LMP 02/02/2022   SpO2 99%   BMI 29.12 kg/m  No intake/output data recorded. No intake/output data recorded.  FHT:  FHR: 135 bpm, variability: moderate,  accelerations:  Present,  decelerations:  Absent UC:   regular, every 2-3 minutes SVE:   Dilation: 4 Effacement (%): 70 Station: -1 Exam by:: HMarcille Buffy CNM BBOW  Labs: Lab Results  Component Value Date   WBC 7.2 11/13/2022   HGB 12.9 11/13/2022   HCT 38.8 11/13/2022   MCV 85.1 11/13/2022   PLT 162 11/13/2022    Assessment / Plan: Induction of labor due to postterm,  progressing well on pitocin  Labor: Progressing on Pitocin, will continue to increase then AROM Preeclampsia:   NA Fetal Wellbeing:  Category I Pain Control:  Labor support without medications I/D:  n/a Anticipated MOD:  NSVD   HMarcille BuffyDNP, CNM  11/13/22  4:09 PM

## 2022-11-13 NOTE — Progress Notes (Signed)
Removed foley bulb at 1030 that was placed in the office yesterday.

## 2022-11-13 NOTE — Progress Notes (Signed)
Shelby Carr is a 27 y.o. G2P0010 at 56w4dadmitted for induction of labor due to Post dates. Due date 11/09/2022.  Subjective: Patient has gotten an epidural and she is comfortable.   Objective: BP 118/76   Pulse 100   Temp 97.6 F (36.4 C) (Oral)   Resp 17   Ht 5' 5"$  (1.651 m)   Wt 79.4 kg   LMP 02/02/2022   SpO2 99%   BMI 29.12 kg/m  No intake/output data recorded. No intake/output data recorded.  FHT:  FHR: 125 bpm, variability: moderate,  accelerations:  Present,  decelerations:  Absent UC:   regular, every 2-3 minutes SVE:   Dilation: 4 Effacement (%): 70 Station: -1 Exam by:: HMarcille Buffy CNM  AROM clear fluid   Labs: Lab Results  Component Value Date   WBC 7.2 11/13/2022   HGB 12.9 11/13/2022   HCT 38.8 11/13/2022   MCV 85.1 11/13/2022   PLT 162 11/13/2022    Assessment / Plan: Induction of labor due to postterm,  progressing well on pitocin AROM now   Labor: progressing normally  Preeclampsia:   NA Fetal Wellbeing:  Category I Pain Control:  Epidural I/D:  n/a Anticipated MOD:  NSVD  HMarcille BuffyDNP, CNM  11/13/22  6:48 PM

## 2022-11-13 NOTE — Anesthesia Procedure Notes (Signed)
Epidural Patient location during procedure: OB Start time: 11/13/2022 6:00 PM End time: 11/13/2022 6:10 PM  Staffing Anesthesiologist: Freddrick March, MD Performed: anesthesiologist   Preanesthetic Checklist Completed: patient identified, IV checked, risks and benefits discussed, monitors and equipment checked, pre-op evaluation and timeout performed  Epidural Patient position: sitting Prep: DuraPrep and site prepped and draped Patient monitoring: continuous pulse ox, blood pressure, heart rate and cardiac monitor Approach: midline Location: L3-L4 Injection technique: LOR air  Needle:  Needle type: Tuohy  Needle gauge: 17 G Needle length: 9 cm Needle insertion depth: 5.5 cm Catheter type: closed end flexible Catheter size: 19 Gauge Catheter at skin depth: 11 cm Test dose: negative  Assessment Sensory level: T8 Events: blood not aspirated, no cerebrospinal fluid, injection not painful, no injection resistance, no paresthesia and negative IV test  Additional Notes Patient identified. Risks/Benefits/Options discussed with patient including but not limited to bleeding, infection, nerve damage, paralysis, failed block, incomplete pain control, headache, blood pressure changes, nausea, vomiting, reactions to medication both or allergic, itching and postpartum back pain. Confirmed with bedside nurse the patient's most recent platelet count. Confirmed with patient that they are not currently taking any anticoagulation, have any bleeding history or any family history of bleeding disorders. Patient expressed understanding and wished to proceed. All questions were answered. Sterile technique was used throughout the entire procedure. Please see nursing notes for vital signs. Test dose was given through epidural catheter and negative prior to continuing to dose epidural or start infusion. Warning signs of high block given to the patient including shortness of breath, tingling/numbness in hands,  complete motor block, or any concerning symptoms with instructions to call for help. Patient was given instructions on fall risk and not to get out of bed. All questions and concerns addressed with instructions to call with any issues or inadequate analgesia.  Reason for block:procedure for pain

## 2022-11-14 ENCOUNTER — Encounter (HOSPITAL_COMMUNITY): Payer: Self-pay | Admitting: Obstetrics & Gynecology

## 2022-11-14 DIAGNOSIS — Z3A40 40 weeks gestation of pregnancy: Secondary | ICD-10-CM

## 2022-11-14 DIAGNOSIS — O480 Post-term pregnancy: Secondary | ICD-10-CM

## 2022-11-14 MED ORDER — BENZOCAINE-MENTHOL 20-0.5 % EX AERO: 1.0000 | INHALATION_SPRAY | CUTANEOUS | Status: DC | PRN

## 2022-11-14 MED ORDER — COCONUT OIL OIL
1.0000 | TOPICAL_OIL | Status: DC | PRN
  Administered 2022-11-14: 1 via TOPICAL

## 2022-11-14 MED ORDER — SIMETHICONE 80 MG PO CHEW: 80.0000 mg | CHEWABLE_TABLET | ORAL | Status: DC | PRN

## 2022-11-14 MED ORDER — ACETAMINOPHEN 325 MG PO TABS: 650.0000 mg | ORAL_TABLET | ORAL | Status: DC | PRN

## 2022-11-14 MED ORDER — ONDANSETRON HCL 4 MG/2ML IJ SOLN: 4.0000 mg | INTRAMUSCULAR | Status: DC | PRN

## 2022-11-14 MED ORDER — TETANUS-DIPHTH-ACELL PERTUSSIS 5-2.5-18.5 LF-MCG/0.5 IM SUSY: 0.5000 mL | PREFILLED_SYRINGE | Freq: Once | INTRAMUSCULAR | Status: DC

## 2022-11-14 MED ORDER — DIPHENHYDRAMINE HCL 25 MG PO CAPS: 25.0000 mg | ORAL_CAPSULE | Freq: Four times a day (QID) | ORAL | Status: DC | PRN

## 2022-11-14 MED ORDER — WITCH HAZEL-GLYCERIN EX PADS: 1.0000 | MEDICATED_PAD | CUTANEOUS | Status: DC | PRN

## 2022-11-14 MED ORDER — IBUPROFEN 600 MG PO TABS
600.0000 mg | ORAL_TABLET | Freq: Four times a day (QID) | ORAL | Status: DC
  Administered 2022-11-14 – 2022-11-16 (×9): 600 mg via ORAL
  Filled 2022-11-14 (×8): qty 1

## 2022-11-14 MED ORDER — MEASLES, MUMPS & RUBELLA VAC IJ SOLR: 0.5000 mL | Freq: Once | INTRAMUSCULAR | Status: DC

## 2022-11-14 MED ORDER — ONDANSETRON HCL 4 MG PO TABS: 4.0000 mg | ORAL_TABLET | ORAL | Status: DC | PRN

## 2022-11-14 MED ORDER — OXYCODONE HCL 5 MG PO TABS: 5.0000 mg | ORAL_TABLET | ORAL | Status: DC | PRN

## 2022-11-14 MED ORDER — ZOLPIDEM TARTRATE 5 MG PO TABS: 5.0000 mg | ORAL_TABLET | Freq: Every evening | ORAL | Status: DC | PRN

## 2022-11-14 MED ORDER — SENNOSIDES-DOCUSATE SODIUM 8.6-50 MG PO TABS
2.0000 | ORAL_TABLET | ORAL | Status: DC
  Administered 2022-11-15 – 2022-11-16 (×2): 2 via ORAL
  Filled 2022-11-14 (×2): qty 2

## 2022-11-14 MED ORDER — PRENATAL MULTIVITAMIN CH
1.0000 | ORAL_TABLET | Freq: Every day | ORAL | Status: DC
  Administered 2022-11-14 – 2022-11-16 (×3): 1 via ORAL
  Filled 2022-11-14 (×3): qty 1

## 2022-11-14 MED ORDER — DIBUCAINE (PERIANAL) 1 % EX OINT: 1.0000 | TOPICAL_OINTMENT | CUTANEOUS | Status: DC | PRN

## 2022-11-14 NOTE — Lactation Note (Signed)
This note was copied from a baby's chart. Lactation Consultation Note  Patient Name: Shelby Carr [id number] Date: 11/14/2022 Reason for consult: Initial assessment;Difficult latch;Mother's request;Term Age:28 hours, term female infant with 4 stools and one void since birth.  Birth Parent requested [place] services due to painful and pinching when infant latches at the breast. Birth Parent given 20 mm NS due to painful latches with pinching and discomfort, LC observed infant has tight upper labial frenulum with short and tight lingual frenulum  that teeters to tongue tip and the  floor of infant's mouth. Birth Parent briefly latched infant on her right breast using the football hold with 20 mm NS, after 5 minutes Birth Parent felt pain, and had to  stopped, LC observed colostrum in NS and abrasion on Birth Parent's nipple.  Birth Parent applied EBM and let air dry and will be receiving coconut oil from RN. Birth Parent  was set up with 21 mm Breast flange DEBP and only pumped left breast due to abrasion on right, Birth Parent was expressing colostrum in breast flange when LC left the room. Infant had two emesis episodes while LC in room, clear thick mucous. Birth Parent will ask Pediatrician, MD to do oral assessment of infant's mouth. LC discussed infant's input and output  with Birth Parent, importance of maternal rest, diet and hydration. Mom made aware of O/P services, breastfeeding support groups, community resources, and our phone # for post-discharge questions.    Birth Parent feeding plan:  1- Birth Parent will continue to apply 20 mm NS when latching infant at the breast, continue to BF by cues, on demand and ask for continued latch assistance from [place]. 2- Birth Parent will think about supplement infant with donor breast milk and continue to use DEBP. If its to painful to latch infant at the breast, Breastfeeding supplementing guidelines given. 3- Birth Parent will continue to pump every 3 hours  for 15 minutes on initial setting and offer her EBM first before donor milk if that is her supplement choice.  Maternal Data Has patient been taught Hand Expression?: Yes Does the patient have breastfeeding experience prior to this delivery?: No  Feeding Mother's Current Feeding Choice: Breast Milk  LATCH Score Latch: Grasps breast easily, tongue down, lips flanged, rhythmical sucking.  Audible Swallowing: A few with stimulation  Type of Nipple: Everted at rest and after stimulation  Comfort (Breast/Nipple): Soft / non-tender  Hold (Positioning): Assistance needed to correctly position infant at breast and maintain latch.  LATCH Score: 8   Lactation Tools Discussed/Used Tools: Pump Breast pump type: Double-Electric Breast Pump Pump Education: Setup, frequency, and cleaning;Milk Storage Reason for Pumping: Birth Parent given 20 mm NS due to painful latches  with pinching and discomfort, LC observed infant has  tight upper labial frenulum  with short and tight lingual frenulum teeters to tongue tip and floor of infant's mouth. Pumping frequency: Birth Parent will pump every 3 hours for 15 minutes on inital setting.  Interventions Interventions: Breast feeding basics reviewed;Adjust position;Support pillows;Assisted with latch;DEBP;Education;Skin to skin;Hand express;Breast compression;Coconut oil;Expressed milk;LC Services brochure  Discharge Pump: Hands Free;DEBP (Mom Cozy DEBP at home.)  Consult Status Consult Status: Follow-up Date: 11/15/22 Follow-up type: In-patient    Eulis Canner 11/14/2022, 10:57 PM

## 2022-11-14 NOTE — Progress Notes (Signed)
Patient ID: Shelby Carr, female   DOB: [birth year], 27 y.o.   MRN: [id number]  Mostly comfortable w epidural; feeling a slight amt of rectal pressure  BP 100/60, P 107 FHR 150s, +accels, avg LTV, +variables Ctx q 102 mins with Pit at 81m/min Cx C/C/vtx +1 @ 0040  IUP@40$ .5wks End 1st stage  -Will begin to push as urge increases -Anticipate vag del  KMyrtis SerCNM 11/14/2022

## 2022-11-14 NOTE — Anesthesia Postprocedure Evaluation (Signed)
Anesthesia Post Note  Patient: Shelby Carr  Procedure(s) Performed: AN AD HOC LABOR EPIDURAL     Patient location during evaluation: Mother Baby Anesthesia Type: Epidural Level of consciousness: awake and alert Pain management: pain level controlled Vital Signs Assessment: post-procedure vital signs reviewed and stable Respiratory status: spontaneous breathing, nonlabored ventilation and respiratory function stable Cardiovascular status: stable Postop Assessment: no headache, no backache and epidural receding Anesthetic complications: no   No notable events documented.  Last Vitals:  Vitals:   11/14/22 0841 11/14/22 1201  BP: 106/74 113/75  Pulse: 95 99  Resp: 17 18  Temp: 36.6 C (!) 36.3 C  SpO2: 98% 98%    Last Pain:  Vitals:   11/14/22 1205  TempSrc:   PainSc: 0-No pain   Pain Goal: Patients Stated Pain Goal: 0 (11/13/22 2010)                 Rayvon Char

## 2022-11-14 NOTE — Discharge Summary (Signed)
Postpartum Discharge Summary  Date of Service updated***     Patient Name: Shelby Carr DOB: 1996/09/06 MRN: [id number]  Date of admission: 11/13/2022 Delivery date:11/14/2022  Delivering provider: Serita Grammes D  Date of discharge: 11/14/2022  Admitting diagnosis: Post-dates pregnancy [O48.0] Post term pregnancy over 40 weeks [O48.0] Intrauterine pregnancy: [redacted]w[redacted]d    Secondary diagnosis:  Active Problems:   Post term pregnancy over 40 weeks  Additional problems: none    Discharge diagnosis: Term Pregnancy Delivered                                              Post partum procedures:{Postpartum procedures:23558} Augmentation: AROM, Pitocin, and OP Foley Complications: None  Hospital course: Induction of Labor With Vaginal Delivery   27y.o. yo G2P0010 at 482w5das admitted to the hospital 11/13/2022 for induction of labor.  Indication for induction: Postdates.  Patient had a uncomplicated labor course.  Membrane Rupture Time/Date: 6:35 PM ,11/13/2022   Delivery Method:Vaginal, Spontaneous  Episiotomy: None  Lacerations:  None  Details of delivery can be found in separate delivery note.  Patient had a postpartum course complicated by***. Patient is discharged home 11/14/22.  Newborn Data: Birth date:11/14/2022  Birth time:5:33 AM  Gender:Female  Living status:Living  Apgars:8 ,9  Weight:3230 g (7lb 1.9oz)  Magnesium Sulfate received: No BMZ received: No Rhophylac:N/A MMR:N/A T-DaP:Given prenatally Flu: Yes Transfusion:No  Physical exam  Vitals:   11/14/22 0600 11/14/22 0615 11/14/22 0632 11/14/22 0646  BP: 104/77 110/68 (!) 122/51 (!) 111/59  Pulse: (!) 102 (!) 104 (!) 114 (!) 104  Resp:      Temp:      TempSrc:      SpO2:      Weight:      Height:       General: {Exam; general:21111117} Lochia: {Desc; appropriate/inappropriate:30686::"appropriate"} Uterine Fundus: {Desc; firm/soft:30687} Incision: {Exam; incision:21111123} DVT Evaluation: {Exam;  dvt:2111122} Labs: Lab Results  Component Value Date   WBC 7.2 11/13/2022   HGB 12.9 11/13/2022   HCT 38.8 11/13/2022   MCV 85.1 11/13/2022   PLT 162 11/13/2022      Latest Ref Rng & Units 06/20/2021    7:00 PM  CMP  Glucose 70 - 99 mg/dL 100   BUN 6 - 20 mg/dL 5   Creatinine 0.44 - 1.00 mg/dL 0.46   Sodium 135 - 145 mmol/L 139   Potassium 3.5 - 5.1 mmol/L 3.6   Chloride 98 - 111 mmol/L 110   CO2 22 - 32 mmol/L 21   Calcium 8.9 - 10.3 mg/dL 9.0   Total Protein 6.5 - 8.1 g/dL 6.3   Total Bilirubin 0.3 - 1.2 mg/dL 0.7   Alkaline Phos 38 - 126 U/L 41   AST 15 - 41 U/L 50   ALT 0 - 44 U/L 44    Edinburgh Score:     No data to display           After visit meds:  Allergies as of 11/14/2022   No Known Allergies   [facility]        Discharge home in stable condition Infant Feeding: {Baby feeding:23562} Infant Disposition:{CHL IP OB HOME WITH MODX:[id number] instruction: per After Visit Summary and Postpartum booklet. Activity: Advance as tolerated. Pelvic rest for 6 weeks.  Diet: routine  diet Future Appointments: Future Appointments  Date Time Provider [place]  12/22/2022  2:35 PM [place]-Kirby, Kathie Dike, CNM [facility]   Follow up Visit: (has PP visit scheduled already)  11/14/2022 Myrtis Ser, CNM

## 2022-11-15 ENCOUNTER — Inpatient Hospital Stay (HOSPITAL_COMMUNITY): Payer: Commercial Managed Care - PPO

## 2022-11-15 NOTE — Progress Notes (Signed)
Post Partum Day 1 Subjective: no complaints, up ad lib, voiding, and tolerating PO. Sore nipples.   Objective: Blood pressure 107/67, pulse 99, temperature 97.7 F (36.5 C), resp. rate 18, height 5' 5"$  (1.651 m), weight 79.4 kg, last menstrual period 02/02/2022, SpO2 98 %, unknown if currently breastfeeding.  Physical Exam:  General: alert, cooperative, and no distress Lochia: appropriate Uterine Fundus: firm Incision: NA DVT Evaluation: No evidence of DVT seen on physical exam.  Recent Labs    11/13/22 1041  HGB 12.9  HCT 38.8    Assessment/Plan: Plan for discharge tomorrow, Breastfeeding, Lactation consult, and Contraception Condoms   LOS: 2 days   [place], [place] 11/15/2022, 6:23 PM

## 2022-11-15 NOTE — Lactation Note (Signed)
This note was copied from a baby's chart. Lactation Consultation Note  Patient Name: Shelby Carr [id number] Date: 11/15/2022 Reason for consult: Mother's request;1st time breastfeeding;Primapara;Term Age:28 hours  LC into visit per lactating parent request. Lactating parent reports challenges with latching and pumping due to nipple pain. Observed bruised nipples, encouraged nipple care with expressed colostrum, coconut oil or comfort gels (proper use explained). LC assisted pumping, LP collected ~3 mL of colostrum with initiation setting to right side breast pumping. LP describes discomfort with 21-mm flange, may need a 19-mm flange. Both parents pace-bottlefed ~30 mL of donor milk plus ~3 mL expressed colostrum.   Plan: 1-Feeding on demand 8-12 times in 24h period. 2-Supplement as needed following guidelines, pacing, upright position and burping frequently.  3-Pump as needed with appropriate flange size as recommended. 4-Encouraged parental rest, hydration and food intake.   Contact LC as needed for feeds/support/concerns/questions. All questions answered at this time. Provided information about resources after discharge.    Maternal Data Has patient been taught Hand Expression?: Yes  Feeding Mother's Current Feeding Choice: Breast Milk and Formula Nipple Type: Nfant Slow Flow (purple)  LATCH Score Lactating parent declined assistance to latch during this encounter due to nipple pain.   Lactation Tools Discussed/Used Tools: Pump;Flanges;Coconut oil;Bottle Flange Size: 21 (needs 19-mm flange) Pump Education: Setup, frequency, and cleaning;Milk Storage Reason for Pumping: stimulation and supplementation Pumping frequency: every 3h Pumped volume: 3 mL  Interventions Interventions: Skin to skin;Hand express;Breast massage;Hand pump;Expressed milk;Coconut oil;Comfort gels;DEBP;Education;Pace feeding;LC Services brochure  Discharge Discharge Education: Engorgement and breast  care;Warning signs for feeding baby Pump: Manual;DEBP;Hands Free;Personal WIC Program: No  Consult Status Consult Status: Follow-up Date: 11/16/22 Follow-up type: In-patient    Cashus Halterman A Higuera Ancidey 11/15/2022, 3:16 PM

## 2022-11-16 MED ORDER — IBUPROFEN 800 MG PO TABS: 800.0000 mg | ORAL_TABLET | Freq: Three times a day (TID) | ORAL | 0 refills | Status: DC | PRN

## 2022-11-16 NOTE — Lactation Note (Signed)
This note was copied from a baby's chart. Lactation Consultation Note  Patient Name: Shelby Carr [id number] Date: 11/16/2022 Reason for consult: Follow-up assessment Age:28 hours  P1, Assisted with breastfeeding after frenectomy.   Mother hand expressed and prepumped prior to latching. Baby latched with ease.  Mother stated the initial latch was less painful.  Encouraged exercises.  Mother will continue to pump and supplement. OP email sent.   Maternal Data Has patient been taught Hand Expression?: Yes  Feeding Mother's Current Feeding Choice: Breast Milk and Donor Milk  LATCH Score Latch: Grasps breast easily, tongue down, lips flanged, rhythmical sucking.  Audible Swallowing: A few with stimulation  Type of Nipple: Everted at rest and after stimulation  Comfort (Breast/Nipple): Soft / non-tender  Hold (Positioning): Assistance needed to correctly position infant at breast and maintain latch.  LATCH Score: 8   Lactation Tools Discussed/Used Tools: Shells;Pump;Comfort gels Flange Size: 21 Breast pump type: Double-Electric Breast Pump Pump Education:  (Frequency) Reason for Pumping: soreness  Interventions Interventions: Assisted with latch;Skin to skin;Hand express;Pre-pump if needed;Education  Discharge Discharge Education: Engorgement and breast care;Warning signs for feeding baby;Outpatient recommendation;Outpatient Epic message sent  Consult Status Consult Status: Complete Date: 11/16/22    Vivianne [facility] 11/16/2022, 12:40 PM

## 2022-11-16 NOTE — Lactation Note (Signed)
This note was copied from a baby's chart. Lactation Consultation Note  Patient Name: Shelby Carr [id number] Date: 11/16/2022 Reason for consult: Follow-up assessment Age:28 hours  P1, Observed mother pumping when LC entered room.  Baby has short anterior lingual frenulum causing pain when she breastfeeds.   Parents have spoken with Ped MD about a frenectomy.   LC provided resource information sheet and exercises for post frenectomy. Mother's nipple are sore and she is pumping.  She recently pumped 3 ml.  Provided mother with hands free pumping band.  Encouraged hand expression before and after pumping and gentle massage during to increase her milk supply. Baby is being fed with donor milk and mother knows depending on her supply she will supplement with formula. Encouraged mother to continue pumping and recommend an outpatient appointment. For soreness suggest mother apply ebm or coconut oil while wearing shells and alternate with comfort gels. Reviewed engorgement care and monitoring voids/stools.   Feeding Mother's Current Feeding Choice: Breast Milk and Donor Milk Nipple Type: Extra Slow Flow   Lactation Tools Discussed/Used Tools: Shells;Pump;Comfort gels Flange Size: 21 Breast pump type: Double-Electric Breast Pump Pump Education:  (Frequency) Reason for Pumping: soreness  Interventions Interventions: Education;DEBP;Pace feeding  Discharge Discharge Education: Engorgement and breast care;Warning signs for feeding baby;Outpatient recommendation;Outpatient Epic message sent  Consult Status Consult Status: Complete Date: 11/16/22    Vivianne Master [facility] 11/16/2022, 10:08 AM

## 2022-11-23 ENCOUNTER — Encounter: Payer: Self-pay | Admitting: *Deleted

## 2022-11-25 ENCOUNTER — Telehealth (HOSPITAL_COMMUNITY): Payer: Self-pay | Admitting: *Deleted

## 2022-11-25 NOTE — Telephone Encounter (Signed)
Attempted hospital discharge follow-up call. Left message for patient to return RN call with any questions or concerns. Erline Levine, RN, 11/25/22, [phone]

## 2022-12-22 ENCOUNTER — Encounter (HOSPITAL_BASED_OUTPATIENT_CLINIC_OR_DEPARTMENT_OTHER): Payer: Self-pay | Admitting: Advanced Practice Midwife

## 2022-12-22 ENCOUNTER — Ambulatory Visit (HOSPITAL_BASED_OUTPATIENT_CLINIC_OR_DEPARTMENT_OTHER): Payer: Commercial Managed Care - PPO | Admitting: Advanced Practice Midwife

## 2022-12-22 DIAGNOSIS — R87610 Atypical squamous cells of undetermined significance on cytologic smear of cervix (ASC-US): Secondary | ICD-10-CM

## 2022-12-22 NOTE — Progress Notes (Signed)
[facility] Partum Visit Note  Shelby Carr is a 27 y.o. G35P1011 female who presents for a postpartum visit. She is 6 weeks postpartum following a normal spontaneous vaginal delivery.  I have fully reviewed the prenatal and intrapartum course. The delivery was at 40 gestational weeks.  Anesthesia: epidural. Postpartum course has been normal. Baby is doing well. Baby is feeding by breast. Bleeding no bleeding. Bowel function is normal. Bladder function is normal. Patient is not sexually active. Contraception method is condoms. Postpartum depression screening: negative.   The pregnancy intention screening data noted above was reviewed. Potential methods of contraception were discussed. The patient elected to proceed with No data recorded.   Edinburgh Postnatal Depression Scale - 12/22/22 1725       Edinburgh Postnatal Depression Scale:  In the Past 7 Days   I have been able to laugh and see the funny side of things. 0    I have looked forward with enjoyment to things. 0    I have blamed myself unnecessarily when things went wrong. 0    I have been anxious or worried for no good reason. 0    I have felt scared or panicky for no good reason. 0    Things have been getting on top of me. 0    I have been so unhappy that I have had difficulty sleeping. 0    I have felt sad or miserable. 0    I have been so unhappy that I have been crying. 0    The thought of harming myself has occurred to me. 0    Edinburgh Postnatal Depression Scale Total 0             Health Maintenance Due  Topic Date Due   COVID-19 Vaccine (3 - 2023-24 season) 06/05/2022    The following portions of the patient's history were reviewed and updated as appropriate: allergies, current medications, past family history, past medical history, past social history, past surgical history, and problem list.  Review of Systems Pertinent items noted in HPI and remainder of comprehensive ROS otherwise negative.  Objective:   BP 101/76 (BP Location: Right Arm, Patient Position: Sitting, Cuff Size: Normal)   Pulse 98   LMP 02/02/2022    VS reviewed, nursing note reviewed,  Constitutional: well developed, well nourished, no distress HEENT: normocephalic CV: normal rate Pulm/chest wall: normal effort Abdomen: soft Neuro: alert and oriented x 3 Skin: warm, dry Psych: affect normal      Assessment:   1. Postpartum examination following vaginal delivery --Doing well, bonding well with baby, breastfeeding is going well, good support at home.   Plan:   Essential components of care per ACOG recommendations:  1.  Mood and well being: Patient with negative depression screening today. Reviewed local resources for support.  - Patient tobacco use? No.   - hx of drug use? No.    2. Infant care and feeding:  -Patient currently breastmilk feeding? Yes. Reviewed importance of draining breast regularly to support lactation.  -Social determinants of health (SDOH) reviewed in EPIC. No concerns  3. Sexuality, contraception and birth spacing - Patient does not want a pregnancy in the next year.   - Reviewed reproductive life planning. Reviewed contraceptive methods based on pt preferences and effectiveness.  Patient desired Female Condom today.   \  4. Sleep and fatigue -Encouraged family/partner/community support of 4 hrs of uninterrupted sleep to help with mood and fatigue  5. Physical Recovery  -  Discussed patients delivery and complications. She describes her labor as good. - Patient had a Vaginal, no problems at delivery. Patient had no laceration. Perineal healing reviewed. Patient expressed understanding - Patient has urinary incontinence? No. - Patient is safe to resume physical and sexual activity  6.  Health Maintenance - HM due items addressed Yes - Last pap smear  Diagnosis  Date Value Ref Range Status  05/08/2021 (A)  Final   - Atypical squamous cells of undetermined significance (ASC-US)   Pap  smear not done at today's visit.  -Breast Cancer screening indicated? No.   7. Chronic Disease/Pregnancy Condition follow up: None  - PCP follow up  Fatima Blank, [place] [facility] [place]

## 2023-03-04 ENCOUNTER — Telehealth: Payer: Self-pay | Admitting: Lactation Services

## 2023-03-04 NOTE — Telephone Encounter (Signed)
Called and spoke with patient. She has had a plugged duct for 4 days. She has tried heat massage, pumping with no relief.   She reports the swelling is noted on the left breast. The area is painful. No redness, no fever or flu like symptoms. Mom reports the area is about the size of a golf ball and is more elongated. She denies milk bleb.  Infant is latching to the breast, she is not feeling it softening much at all with feeds.   She is taking Sunflower Lecithin 4 capsules a day for 3 days.   Reviewed trying:   Ice packs to breast for 10-15 minutes prior to pumping or feeding Limiting massage Pump or feed on regular basis Point infant nose or chin to clogged area when feeding Dangle feeding Ibuprofen 600 mg every 6 hours Continue Sunflower Lecithin  Reviewed calling OB if develops redness to breast, fever or flu like symptoms or noting pus or mucous coming out of breast when pumping.   Will call back next week to check on patient.

## 2023-03-10 NOTE — Telephone Encounter (Addendum)
Called patient to assess plugged duct. She did not answer. LM for her to call the office if she continues to have concerns with Lactation.

## 2023-03-15 NOTE — Telephone Encounter (Signed)
Called patient to assess plugged duct. She did not answer. LM for her to call the office if she continues to have concerns with Lactation.  

## 2023-09-07 ENCOUNTER — Telehealth: Payer: Self-pay | Admitting: Family Medicine

## 2023-09-07 NOTE — Telephone Encounter (Signed)
Pt is calling in to schedule a appt but dr Linford Arnold is not coming up in epic for me to schedule the annual wellness visit patient will need a call back regarding this matter.

## 2023-09-09 NOTE — Progress Notes (Signed)
  [facility] [address] [place] [address] Phone: [phone] Subjective:   Bruce Donath, am serving as a scribe for Dr. Antoine Primas.  I'm seeing this patient by the request  of:  Agapito Games, MD  CC: back and neck pain follow up   [id number]  Shelby Carr is a 27 y.o. female coming in with complaint of back and neck pain. OMT 11/04/2022. Patient states that she is having some anterior hip pain [facility]. Did a lot of shopping but also   Medications patient has been prescribed: None  Taking:         Reviewed prior external information including notes and imaging from previsou exam, outside providers and external EMR if available.   As well as notes that were available from care everywhere and other healthcare systems.  Past medical history, social, surgical and family history all reviewed in electronic medical record.  No pertanent information unless stated regarding to the chief complaint.   Past Medical History:  Diagnosis Date   Anemia    History of supraventricular tachycardia    in high school    No Known Allergies   Review of Systems:  No headache, visual changes, nausea, vomiting, diarrhea, constipation, dizziness, abdominal pain, skin rash, fevers, chills, night sweats, weight loss, swollen lymph nodes, body aches, joint swelling, chest pain, shortness of breath, mood changes. POSITIVE muscle aches  Objective  Blood pressure 112/78, pulse 78, height 5\' 5"  (1.651 m), weight 175 lb (79.4 kg), SpO2 99%, unknown if currently breastfeeding.   General: No apparent distress alert and oriented x3 mood and affect normal, dressed appropriately.  HEENT: Pupils equal, extraocular movements intact  Respiratory: Patient's speak in full sentences and does not appear short of breath  Cardiovascular: No lower extremity edema, non tender, no erythema  MSK:  Back does have loss of lordosis does have tightness  on the right side does have more of a Faber positive.  Pain over the right sacroiliac joint  Osteopathic findings T6 extended rotated and side bent right T9 extended rotated and side bent left L1 flexed rotated and side bent right Sacrum right on right       Assessment and Plan:  Right hip flexor tightness Patient does have tightness noted.  Discussed icing regimen and home exercises.  We discussed proper lifting of her 89-month-old daughter.  Increase activity slowly.  Discussed posture and ergonomics otherwise.  After evaluation today decided to try osteopathic manipulation.  Responded well to that.  Follow-up again in 6 to 8 weeks otherwise.    Nonallopathic problems  Decision today to treat with OMT was based on Physical Exam  After verbal consent patient was treated with HVLA, ME, FPR techniques in  thoracic, lumbar, and sacral  areas  Patient tolerated the procedure well with improvement in symptoms  Patient given exercises, stretches and lifestyle modifications  See medications in patient instructions if given  Patient will follow up in 4-8 weeks    The above documentation has been reviewed and is accurate and complete Judi Saa, DO          Note: This dictation was prepared with Dragon dictation along with smaller phrase technology. Any transcriptional errors that result from this process are unintentional.

## 2023-09-14 ENCOUNTER — Encounter: Payer: Self-pay | Admitting: Family Medicine

## 2023-09-14 ENCOUNTER — Ambulatory Visit: Payer: Commercial Managed Care - PPO | Admitting: Family Medicine

## 2023-09-14 VITALS — BP 112/78 | HR 78 | Ht 65.0 in | Wt 175.0 lb

## 2023-09-14 DIAGNOSIS — M24551 Contracture, right hip: Secondary | ICD-10-CM | POA: Diagnosis not present

## 2023-09-14 DIAGNOSIS — M9902 Segmental and somatic dysfunction of thoracic region: Secondary | ICD-10-CM | POA: Diagnosis not present

## 2023-09-14 DIAGNOSIS — M9903 Segmental and somatic dysfunction of lumbar region: Secondary | ICD-10-CM

## 2023-09-14 DIAGNOSIS — M9904 Segmental and somatic dysfunction of sacral region: Secondary | ICD-10-CM | POA: Diagnosis not present

## 2023-09-14 NOTE — Patient Instructions (Signed)
Great to see you  Exercises 2-3 times a week Enjoying the baby and happy holidays see me again in 6 to 8 weeks

## 2023-09-14 NOTE — Assessment & Plan Note (Signed)
Patient does have tightness noted.  Discussed icing regimen and home exercises.  We discussed proper lifting of her 67-month-old daughter.  Increase activity slowly.  Discussed posture and ergonomics otherwise.  After evaluation today decided to try osteopathic manipulation.  Responded well to that.  Follow-up again in 6 to 8 weeks otherwise.

## 2023-09-30 ENCOUNTER — Ambulatory Visit (INDEPENDENT_AMBULATORY_CARE_PROVIDER_SITE_OTHER): Payer: Commercial Managed Care - PPO | Admitting: Family Medicine

## 2023-09-30 ENCOUNTER — Encounter: Payer: Self-pay | Admitting: Family Medicine

## 2023-09-30 VITALS — BP 98/52 | HR 85 | Ht 65.0 in | Wt 143.0 lb

## 2023-09-30 DIAGNOSIS — Z0000 Encounter for general adult medical examination without abnormal findings: Secondary | ICD-10-CM

## 2023-09-30 DIAGNOSIS — B360 Pityriasis versicolor: Secondary | ICD-10-CM

## 2023-09-30 MED ORDER — KETOCONAZOLE 2 % EX CREA: 1.0000 | TOPICAL_CREAM | Freq: Two times a day (BID) | CUTANEOUS | 0 refills | Status: DC

## 2023-09-30 NOTE — Addendum Note (Signed)
Addended by: Nani Gasser D on: 09/30/2023 09:19 AM   Modules accepted: Orders

## 2023-09-30 NOTE — Progress Notes (Addendum)
Complete physical exam  Patient: Shelby Carr   DOB: 08-30-1996   27 y.o. Female  MRN: [id number]  Subjective:    Chief Complaint  Patient presents with   Annual Exam    Shelby Carr is a 27 y.o. female who presents today for a complete physical exam. She reports consuming a general diet. The patient does not participate in regular exercise at present. She generally feels fairly well. She reports sleeping well. She does not have additional problems to discuss today.   Had a daughter in Feb.  Rash on chest for 1 year. Not painful or itchy. Got worse when breast feeding.   Most recent fall risk assessment:    08/28/2020   10:34 AM  Fall Risk   Falls in the past year? 0     Most recent depression screenings:    09/30/2023    8:52 AM 10/15/2022    5:08 PM  PHQ 2/9 Scores  PHQ - 2 Score 0 0  PHQ- 9 Score  1        Patient Care Team: Agapito Games, MD as PCP - General (Family Medicine) Jerene Bears, MD as PCP - OBGYN (Obstetrics and Gynecology)   Outpatient Medications Prior to Visit  Medication Sig   [DISCONTINUED] Ascorbic Acid (VITAMIN C PO) Take by mouth.   [DISCONTINUED] ferrous sulfate 324 MG TBEC Take 324 mg by mouth.   [DISCONTINUED] ibuprofen (ADVIL) 800 MG tablet Take 1 tablet (800 mg total) by mouth every 8 (eight) hours as needed.   [DISCONTINUED] Prenatal Vit-Fe Fumarate-FA (PRENATAL VITAMIN PO) Take by mouth.   [DISCONTINUED] RSV bivalent vaccine (ABRYSVO) 120 MCG/0.5ML injection Inject into the muscle.   No facility-administered medications prior to visit.    ROS        Objective:     BP (!) 98/52   Pulse 85   Ht 5\' 5"  (1.651 m)   Wt 143 lb (64.9 kg)   LMP 09/22/2023 (Exact Date)   SpO2 96%   Breastfeeding No   BMI 23.80 kg/m    Physical Exam Vitals and nursing note reviewed.  Constitutional:      Appearance: Normal appearance.  HENT:     Head: Normocephalic and atraumatic.  Eyes:     Conjunctiva/sclera:  Conjunctivae normal.  Cardiovascular:     Rate and Rhythm: Normal rate and regular rhythm.  Pulmonary:     Effort: Pulmonary effort is normal.     Breath sounds: Normal breath sounds.  Skin:    General: Skin is warm and dry.     Comments: Pink well demarcated rash in between breast.    Neurological:     Mental Status: She is alert.  Psychiatric:        Mood and Affect: Mood normal.      No results found for any visits on 09/30/23.     Assessment & Plan:    Routine Health Maintenance and Physical Exam  Immunization History  Administered Date(s) Administered   DTaP 12/17/1994, 01/21/1995, 03/02/1995, 05/04/1995, 02/28/2000   HIB (PRP-OMP) 12/17/1994, 03/02/1995, 05/04/1995, 01/21/1996   HPV 9-valent 10/01/2017, 12/10/2017, 04/01/2018   Hepatitis A 04/30/2008, 11/05/2008   Hepatitis B 10-27-1994, 12/17/1994, 05/04/1995   IPV 12/17/1994, 03/02/1995, 05/04/1995, 02/28/2000   Influenza,inj,Quad PF,6+ Mos 06/13/2019, 05/29/2020   Influenza-Unspecified 07/23/2013, 07/29/2022   MMR 01/21/1996, 02/28/2000   Meningococcal Conjugate 03/03/2007, 04/22/2012   PFIZER(Purple Top)SARS-COV-2 Vaccination 12/06/2019, 01/03/2020   Rsv, Bivalent, Protein Subunit Rsvpref,pf Verdis Frederickson) 10/15/2022   Tdap  04/22/2012, 11/29/2015, 08/17/2022    Health Maintenance  Topic Date Due   INFLUENZA VACCINE  01/03/2024 (Originally 05/06/2023)   COVID-19 Vaccine (3 - 2024-25 season) 09/14/2024 (Originally 06/06/2023)   Cervical Cancer Screening (Pap smear)  05/08/2024   DTaP/Tdap/Td (8 - Td or Tdap) 08/17/2032   HPV VACCINES  Completed   Hepatitis C Screening  Completed   HIV Screening  Completed    Discussed health benefits of physical activity, and encouraged her to engage in regular exercise appropriate for her age and condition.  Problem List Items Addressed This Visit   None Visit Diagnoses       Wellness examination    -  Primary     Tinea versicolor       Relevant Medications   ketoconazole  (NIZORAL) 2 % cream       Keep up a regular exercise program and make sure you are eating a healthy diet Try to eat 4 servings of dairy a day, or if you are lactose intolerant take a calcium with vitamin D daily.  Your vaccines are up to date.  Counseling on contraception.  Expelled IUD.  Consider Nuvaring.    Tinea versicolor - will treat with ketoconazole cream bid x 3-4 weeks until rash completely resolved. Apply past border of rash.   Return in about 1 year (around 09/29/2024) for Wellness Exam.     Nani Gasser, MD

## 2024-04-11 ENCOUNTER — Telehealth: Admitting: Physician Assistant

## 2024-04-11 DIAGNOSIS — B9789 Other viral agents as the cause of diseases classified elsewhere: Secondary | ICD-10-CM | POA: Diagnosis not present

## 2024-04-11 DIAGNOSIS — J019 Acute sinusitis, unspecified: Secondary | ICD-10-CM

## 2024-04-11 MED ORDER — IPRATROPIUM BROMIDE 0.03 % NA SOLN: 2.0000 | Freq: Two times a day (BID) | NASAL | 0 refills | Status: AC

## 2024-04-11 NOTE — Progress Notes (Signed)
 I have spent 5 minutes in review of e-visit questionnaire, review and updating patient chart, medical decision making and response to patient.   Piedad Climes, PA-C

## 2024-04-11 NOTE — Progress Notes (Signed)

## 2024-08-30 ENCOUNTER — Ambulatory Visit (HOSPITAL_BASED_OUTPATIENT_CLINIC_OR_DEPARTMENT_OTHER)

## 2024-08-30 ENCOUNTER — Encounter (HOSPITAL_BASED_OUTPATIENT_CLINIC_OR_DEPARTMENT_OTHER): Payer: Self-pay

## 2024-08-30 VITALS — BP 122/73 | HR 97 | Wt 146.0 lb

## 2024-08-30 DIAGNOSIS — N912 Amenorrhea, unspecified: Secondary | ICD-10-CM

## 2024-08-30 DIAGNOSIS — Z3480 Encounter for supervision of other normal pregnancy, unspecified trimester: Secondary | ICD-10-CM | POA: Insufficient documentation

## 2024-08-30 DIAGNOSIS — Z3200 Encounter for pregnancy test, result unknown: Secondary | ICD-10-CM

## 2024-08-30 DIAGNOSIS — Z3201 Encounter for pregnancy test, result positive: Secondary | ICD-10-CM

## 2024-08-30 DIAGNOSIS — O3680X0 Pregnancy with inconclusive fetal viability, not applicable or unspecified: Secondary | ICD-10-CM

## 2024-08-30 DIAGNOSIS — Z3A01 Less than 8 weeks gestation of pregnancy: Secondary | ICD-10-CM

## 2024-08-30 LAB — POCT URINE PREGNANCY: Preg Test, Ur: POSITIVE — AB

## 2024-08-30 MED ORDER — PRENATAL 27-1 MG PO TABS: 1.0000 | ORAL_TABLET | Freq: Every day | ORAL | 11 refills | Status: AC

## 2024-08-30 NOTE — Progress Notes (Signed)
 NURSE VISIT- PREGNANCY CONFIRMATION   SUBJECTIVE:  Shelby Carr is a 28 y.o. G50P1011 female at Unknown by certain LMP of Patient's last menstrual period was 07/24/2024 (exact date). Here for pregnancy confirmation.  Home pregnancy test: positive x 2  She reports nausea.  She is taking prenatal vitamins.    I explained I am completing New OB Intake today. We discussed EDD of 04/30/2025 based on LMP of 07/24/2024. Pt is G3P1011. I reviewed her allergies, medications and Medical/Surgical/OB history.    Patient Active Problem List   Diagnosis Date Noted   ASCUS of cervix with negative high risk HPV 12/22/2022   Right hip flexor tightness 08/26/2022   Somatic dysfunction of spine, lumbar 08/26/2022   History of supraventricular tachycardia 04/04/2013     Concerns addressed today  MyChart/Babyscripts MyChart access verified. I explained pt will have some visits in office and some virtually. Babyscripts instructions given and order placed. Patient verifies receipt of registration text/e-mail. Account successfully created and app downloaded.  Blood Pressure Cuff/Weight Scale Patient has a blood pressure cuff at home. Explained after first prenatal appt pt will check weekly and document in Babyscripts. Patient does have weight scale  Is patient a candidate for Babyscripts Optimization? Yes, patient accepted   Last Pap Diagnosis  Date Value Ref Range Status  05/08/2021 (A)  Final   - Atypical squamous cells of undetermined significance (ASC-US )   OBJECTIVE:  BP 122/73 (BP Location: Right Arm, Patient Position: Sitting, Cuff Size: Normal)   Pulse 97   Wt 146 lb (66.2 kg)   LMP 07/24/2024 (Exact Date)   SpO2 100%   Breastfeeding No   BMI 24.30 kg/m   Appears well, in no apparent distress  Results for orders placed or performed in visit on 08/30/24 (from the past 24 hours)  POCT urine pregnancy   Collection Time: 08/30/24 10:34 AM  Result Value Ref Range   Preg Test, Ur Positive  (A) Negative    ASSESSMENT: Positive pregnancy test. LMP 07/24/2024. EDD 04/30/2025.  PLAN: Prenatal vitamins: continue. Rx sent for Prenatal 27-1 mg take 1 tablet daily #30 11RF for patient to check cost at the pharmacy with insurance. Nausea medicines: not currently needed   Viability scan ordered and scheduled for 09/20/2024 at 9 am. New OB scheduled for 1:15 pm at 3:55 pm with Nidia Daring, NP. New OB packet provided and reviewed with patient. Advised to return completed forms at new OB appointment.   Tekoa Hamor E, RN 08/30/2024  10:53 AM

## 2024-09-20 ENCOUNTER — Other Ambulatory Visit (HOSPITAL_BASED_OUTPATIENT_CLINIC_OR_DEPARTMENT_OTHER)

## 2024-09-20 DIAGNOSIS — Z3A01 Less than 8 weeks gestation of pregnancy: Secondary | ICD-10-CM

## 2024-09-20 DIAGNOSIS — O3680X0 Pregnancy with inconclusive fetal viability, not applicable or unspecified: Secondary | ICD-10-CM

## 2024-09-20 DIAGNOSIS — Z3687 Encounter for antenatal screening for uncertain dates: Secondary | ICD-10-CM | POA: Diagnosis not present

## 2024-09-29 ENCOUNTER — Telehealth (HOSPITAL_BASED_OUTPATIENT_CLINIC_OR_DEPARTMENT_OTHER): Payer: Self-pay

## 2024-09-29 ENCOUNTER — Inpatient Hospital Stay (HOSPITAL_COMMUNITY)
Admission: AD | Admit: 2024-09-29 | Discharge: 2024-09-29 | Discharge status: Against Medical Advice | Attending: Obstetrics & Gynecology | Admitting: Obstetrics & Gynecology

## 2024-09-29 DIAGNOSIS — Z3A01 Less than 8 weeks gestation of pregnancy: Secondary | ICD-10-CM | POA: Insufficient documentation

## 2024-09-29 DIAGNOSIS — O209 Hemorrhage in early pregnancy, unspecified: Secondary | ICD-10-CM | POA: Diagnosis present

## 2024-09-29 DIAGNOSIS — Z3480 Encounter for supervision of other normal pregnancy, unspecified trimester: Secondary | ICD-10-CM

## 2024-09-29 NOTE — Telephone Encounter (Signed)
 Left message to call Vail Basista at [phone].  Calling to advise patient that I have spoken with Dr.Miller. It is okay to continue monitoring if she would like and come in for a beta hcg level today. Will need to keep PUS for 12/31. She may also take Misoprostol  800 mcg po/buccal/vag repeat in 3 hours (no sooner). Will need to keep PUS for 12/31 if she takes Misoprostol .

## 2024-09-29 NOTE — Telephone Encounter (Signed)
 Returned call to patient from voicemail left on nurse line. Patient reports that she began having moderate bleeding and cramping on 12/23. Based on US  from 12/17 patient is [redacted]w[redacted]d. Reports she is no longer having any cramping or pain. Bleeding is still moderate. Changing pad every 2 hours for hygiene not because the pad is full. Denies any heavy bleeding or passing any tissue or clots. Denies fever, chills, nausea, or vomiting. Patient has a follow up ultrasound scheduled for 12/31. Advised patient to call back or seek immediate medical care if bleeding worsens or soaking through 1 pad/tampon per hour for two hours or if becomes symptomatic with sob, chest pain, fatigue, lightheaded, weakness, fever, chills, nausea, or vomiting. Patient verbalizes understanding. Advised will review with Dr.Miller and return call.

## 2024-09-29 NOTE — MAU Note (Signed)
 Pt says she went to [facility] last Wed - to get U/S - said baby was 6 weeks - had heartbeat .  Has another U/S sch on 12-31 Has had brown vag bleeding- on a pad- started on 12-23. Then at 4pm today - changed to red  vag bleeding on a pad.  Pad in Triage - light amt - light red.  Also cramping started 12-23- but stopped . Then cramping returned today at 4pm- 1/10

## 2024-09-29 NOTE — Telephone Encounter (Signed)
 Left message to call Nayab Aten at [phone].

## 2024-10-01 ENCOUNTER — Encounter (HOSPITAL_COMMUNITY): Payer: Self-pay | Admitting: Obstetrics & Gynecology

## 2024-10-01 ENCOUNTER — Inpatient Hospital Stay (HOSPITAL_COMMUNITY)
Admission: AD | Admit: 2024-10-01 | Discharge: 2024-10-01 | Disposition: A | Discharge status: Home / Self Care | Attending: Obstetrics & Gynecology | Admitting: Obstetrics & Gynecology

## 2024-10-01 ENCOUNTER — Inpatient Hospital Stay (HOSPITAL_COMMUNITY)

## 2024-10-01 ENCOUNTER — Other Ambulatory Visit: Payer: Self-pay

## 2024-10-01 DIAGNOSIS — Z113 Encounter for screening for infections with a predominantly sexual mode of transmission: Secondary | ICD-10-CM | POA: Diagnosis present

## 2024-10-01 DIAGNOSIS — Z3A09 9 weeks gestation of pregnancy: Secondary | ICD-10-CM | POA: Diagnosis not present

## 2024-10-01 DIAGNOSIS — R1030 Lower abdominal pain, unspecified: Secondary | ICD-10-CM | POA: Diagnosis present

## 2024-10-01 DIAGNOSIS — O039 Complete or unspecified spontaneous abortion without complication: Secondary | ICD-10-CM

## 2024-10-01 DIAGNOSIS — N939 Abnormal uterine and vaginal bleeding, unspecified: Secondary | ICD-10-CM

## 2024-10-01 DIAGNOSIS — O209 Hemorrhage in early pregnancy, unspecified: Secondary | ICD-10-CM | POA: Diagnosis present

## 2024-10-01 LAB — ABO/RH: ABO/RH(D): A POS

## 2024-10-01 LAB — CBC
HCT: 35.7 % — ABNORMAL LOW (ref 36.0–46.0)
Hemoglobin: 12.4 g/dL (ref 12.0–15.0)
MCH: 28.1 pg (ref 26.0–34.0)
MCHC: 34.7 g/dL (ref 30.0–36.0)
MCV: 81 fL (ref 80.0–100.0)
Platelets: 250 K/uL (ref 150–400)
RBC: 4.41 MIL/uL (ref 3.87–5.11)
RDW: 12.8 % (ref 11.5–15.5)
WBC: 11.9 K/uL — ABNORMAL HIGH (ref 4.0–10.5)
nRBC: 0 % (ref 0.0–0.2)

## 2024-10-01 LAB — WET PREP, GENITAL
Clue Cells Wet Prep HPF POC: NONE SEEN
Sperm: NONE SEEN
Trich, Wet Prep: NONE SEEN
WBC, Wet Prep HPF POC: <10
Yeast Wet Prep HPF POC: NONE SEEN

## 2024-10-01 LAB — HCG, QUANTITATIVE, PREGNANCY: hCG, Beta Chain, Quant, S: 28574 m[IU]/mL — ABNORMAL HIGH

## 2024-10-01 MED ORDER — LOPERAMIDE HCL 2 MG PO TABS: 2.0000 mg | ORAL_TABLET | Freq: Four times a day (QID) | ORAL | 0 refills | Status: AC | PRN

## 2024-10-01 MED ORDER — MISOPROSTOL 200 MCG PO TABS: ORAL_TABLET | ORAL | 1 refills | Status: AC

## 2024-10-01 MED ORDER — LOPERAMIDE HCL 2 MG PO TABS: 2.0000 mg | ORAL_TABLET | Freq: Four times a day (QID) | ORAL | 0 refills | Status: DC | PRN

## 2024-10-01 MED ORDER — MISOPROSTOL 200 MCG PO TABS: ORAL_TABLET | ORAL | 1 refills | Status: DC

## 2024-10-01 MED ORDER — ONDANSETRON 4 MG PO TBDP: 4.0000 mg | ORAL_TABLET | Freq: Three times a day (TID) | ORAL | 0 refills | Status: DC | PRN

## 2024-10-01 MED ORDER — OXYCODONE-ACETAMINOPHEN 5-325 MG PO TABS: 1.0000 | ORAL_TABLET | ORAL | 0 refills | Status: AC | PRN

## 2024-10-01 MED ORDER — ONDANSETRON 4 MG PO TBDP: 4.0000 mg | ORAL_TABLET | Freq: Three times a day (TID) | ORAL | 0 refills | Status: AC | PRN

## 2024-10-01 MED ORDER — OXYCODONE-ACETAMINOPHEN 5-325 MG PO TABS: 1.0000 | ORAL_TABLET | ORAL | 0 refills | Status: DC | PRN

## 2024-10-01 NOTE — MAU Note (Signed)
 Shelby Carr is a 28 y.o. at [redacted]w[redacted]d here in MAU reporting: was here 2 days ago but left before being seen - reports VB for 5 days. Around 1845 had a big gush and passed a lot of tissue - continue to have heavy VB since. Having lower abdominal pain that feels like contractions.   Pain score: 8 Vitals:   10/01/24 2034  BP: 109/83  Pulse: (!) 114  Resp: 16  Temp: 98.9 F (37.2 C)  SpO2: 100%     FHT: NA  Lab orders placed from triage: none

## 2024-10-01 NOTE — MAU Provider Note (Signed)
" °  History     CSN: [id number]  Arrival date and time: 10/01/24 1946   Event Date/Time   First Provider Initiated Contact with Patient 10/01/24 2159      Chief Complaint  Patient presents with   Vaginal Bleeding   Abdominal Pain   HPI  {GYN/OB YK:[id number]}  Past Medical History:  Diagnosis Date   Anemia    History of supraventricular tachycardia    in high school    Past Surgical History:  Procedure Laterality Date   TONSILLECTOMY AND ADENOIDECTOMY  10/05/1998   WISDOM TOOTH EXTRACTION Bilateral     Family History  Problem Relation Age of Onset   Healthy Mother    Healthy Father    Alcohol abuse Maternal Grandmother    Hyperlipidemia Maternal Grandfather    Heart attack Paternal Grandfather    Hypertension Paternal Grandfather    Liver disease Paternal Grandfather     Social History[1]  Allergies: Allergies[2]  Medications Prior to Admission  Medication Sig Dispense Refill Last Dose/Taking   Prenatal 27-1 MG TABS Take 1 tablet by mouth daily. 30 tablet 11 10/01/2024   ipratropium (ATROVENT ) 0.03 % nasal spray Place 2 sprays into both nostrils every 12 (twelve) hours. (Patient not taking: Reported on 08/30/2024) 30 mL 0    Prenatal Vit-Fe Fumarate-FA (PRENATAL MULTIVITAMIN) TABS tablet Take 1 tablet by mouth daily at 12 noon.       Review of Systems Physical Exam   Blood pressure 125/65, pulse 93, temperature 99.2 F (37.3 C), temperature source Oral, resp. rate 16, height 5' 4 (1.626 m), weight 67.6 kg, last menstrual period 07/24/2024, SpO2 99%, not currently breastfeeding.  Physical Exam  MAU Course  Procedures  MDM ***  Assessment and Plan  ***  Shelby Carr Cedar 10/01/2024, 9:59 PM     [1]  Social History Tobacco Use   Smoking status: Never   Smokeless tobacco: Never  Vaping Use   Vaping status: Never Used  Substance Use Topics   Alcohol use: Not Currently   Drug use: Never  [2] No Known Allergies  "

## 2024-10-02 LAB — GC/CHLAMYDIA PROBE AMP (~~LOC~~) NOT AT ARMC
Chlamydia: NEGATIVE
Comment: NEGATIVE
Comment: NORMAL
Neisseria Gonorrhea: NEGATIVE

## 2024-10-02 NOTE — Telephone Encounter (Signed)
 Left message to call Nayab Aten at [phone].

## 2024-10-02 NOTE — Telephone Encounter (Signed)
 Spoke with patient. Patient was seen at MAU on 09/29/2024. Patient reports that her bleeding has decreased today. Changing a pad q2h for cleanliness. Passing small clots. Denies any heavy bleeding, passing large clots, fever, chills, nausea or vomiting. Patient states that she was given cytotec  but she has not decided if she is going to take it yet or not. Patient's hcg quant 12/28 was 28,574. Advised patient okay to continue to consider and monitor bleeding. Advised if she develops any heavy bleeding, fever, chills, nausea, vomiting needs to be seen for immediate evaluation. Patient verbalizes understanding. Advised to keep her ultrasound appointment as scheduled for 10/04/2024 10 10:30 am and follow up with Dr.Miller at 11:15 am.

## 2024-10-03 NOTE — Progress Notes (Unsigned)
" ° °  GYNECOLOGY  VISIT  CC:   No chief complaint on file.   HPI: 28 y.o. G64P1011 Married White or Caucasian female here for ultrasound follow up after SAB.  Patient's last menstrual period was 07/24/2024 (exact date).  Past Medical History:  Diagnosis Date   Anemia    History of supraventricular tachycardia    in high school    MEDS:  Reviewed in EPIC  ALLERGIES: Patient has no known allergies.  SH:  ***  ROS  PHYSICAL EXAMINATION:    LMP 07/24/2024 (Exact Date)     General appearance: alert, cooperative and appears stated age Neck: no adenopathy, supple, symmetrical, trachea midline and thyroid {CHL AMB PHY EX THYROID NORM DEFAULT:[phone]::normal to inspection and palpation} CV:  {Exam; heart brief:31539} Lungs:  {pe lungs ob:314451} Breasts: {Exam; breast:13139::normal appearance, no masses or tenderness} Abdomen: soft, non-tender; bowel sounds normal; no masses,  no organomegaly Lymph:  no inguinal LAD noted  Pelvic: External genitalia:  no lesions              Urethra:  normal appearing urethra with no masses, tenderness or lesions              Bartholins and Skenes: normal                 Vagina: {exam; pelvic vaginal:30846}              Cervix: {CHL AMB PHY EX CERVIX NORM DEFAULT:[phone]::no lesions}              Bimanual Exam:  Uterus:  {CHL AMB PHY EX UTERUS NORM DEFAULT:[phone]::normal size, contour, position, consistency, mobility, non-tender}              Adnexa: {CHL AMB PHY EX ADNEXA NO MASS DEFAULT:[phone]::no mass, fullness, tenderness}              Rectovaginal: {yes no:[id number]}.  Confirms.              Anus:  normal sphincter tone, no lesions  Chaperone was present for exam.  Assessment/Plan: There are no diagnoses linked to this encounter.  "

## 2024-10-04 ENCOUNTER — Encounter (HOSPITAL_BASED_OUTPATIENT_CLINIC_OR_DEPARTMENT_OTHER): Payer: Self-pay | Admitting: Obstetrics & Gynecology

## 2024-10-04 ENCOUNTER — Ambulatory Visit (HOSPITAL_BASED_OUTPATIENT_CLINIC_OR_DEPARTMENT_OTHER): Admitting: Obstetrics & Gynecology

## 2024-10-04 ENCOUNTER — Ambulatory Visit (HOSPITAL_BASED_OUTPATIENT_CLINIC_OR_DEPARTMENT_OTHER)

## 2024-10-04 ENCOUNTER — Other Ambulatory Visit (HOSPITAL_BASED_OUTPATIENT_CLINIC_OR_DEPARTMENT_OTHER): Payer: Self-pay

## 2024-10-04 VITALS — BP 124/81 | HR 108 | Wt 146.0 lb

## 2024-10-04 DIAGNOSIS — O039 Complete or unspecified spontaneous abortion without complication: Secondary | ICD-10-CM | POA: Diagnosis not present

## 2024-10-04 DIAGNOSIS — Z3A00 Weeks of gestation of pregnancy not specified: Secondary | ICD-10-CM | POA: Diagnosis not present

## 2024-10-16 ENCOUNTER — Other Ambulatory Visit (HOSPITAL_BASED_OUTPATIENT_CLINIC_OR_DEPARTMENT_OTHER): Payer: Self-pay

## 2024-10-16 DIAGNOSIS — O039 Complete or unspecified spontaneous abortion without complication: Secondary | ICD-10-CM

## 2024-10-19 ENCOUNTER — Encounter (HOSPITAL_BASED_OUTPATIENT_CLINIC_OR_DEPARTMENT_OTHER): Admitting: Obstetrics and Gynecology

## 2024-10-19 ENCOUNTER — Other Ambulatory Visit (HOSPITAL_BASED_OUTPATIENT_CLINIC_OR_DEPARTMENT_OTHER)

## 2024-10-19 LAB — BETA HCG QUANT (REF LAB): hCG Quant: 140 m[IU]/mL

## 2024-10-26 ENCOUNTER — Encounter (HOSPITAL_BASED_OUTPATIENT_CLINIC_OR_DEPARTMENT_OTHER): Payer: Self-pay

## 2024-10-27 ENCOUNTER — Other Ambulatory Visit (HOSPITAL_BASED_OUTPATIENT_CLINIC_OR_DEPARTMENT_OTHER): Payer: Self-pay

## 2024-10-27 DIAGNOSIS — O039 Complete or unspecified spontaneous abortion without complication: Secondary | ICD-10-CM

## 2024-10-28 ENCOUNTER — Ambulatory Visit (HOSPITAL_BASED_OUTPATIENT_CLINIC_OR_DEPARTMENT_OTHER): Payer: Self-pay | Admitting: Obstetrics & Gynecology

## 2024-11-01 ENCOUNTER — Other Ambulatory Visit (HOSPITAL_BASED_OUTPATIENT_CLINIC_OR_DEPARTMENT_OTHER): Payer: Self-pay

## 2024-11-01 ENCOUNTER — Other Ambulatory Visit (HOSPITAL_BASED_OUTPATIENT_CLINIC_OR_DEPARTMENT_OTHER)

## 2024-11-01 DIAGNOSIS — O039 Complete or unspecified spontaneous abortion without complication: Secondary | ICD-10-CM

## 2024-11-02 ENCOUNTER — Ambulatory Visit (HOSPITAL_BASED_OUTPATIENT_CLINIC_OR_DEPARTMENT_OTHER): Payer: Self-pay | Admitting: Obstetrics & Gynecology

## 2024-11-02 DIAGNOSIS — O039 Complete or unspecified spontaneous abortion without complication: Secondary | ICD-10-CM

## 2024-11-02 LAB — BETA HCG QUANT (REF LAB): hCG Quant: 17 m[IU]/mL

## 2024-11-15 ENCOUNTER — Other Ambulatory Visit (HOSPITAL_BASED_OUTPATIENT_CLINIC_OR_DEPARTMENT_OTHER): Payer: Self-pay
